# Patient Record
Sex: Male | Born: 1960 | Race: White | Hispanic: No | Marital: Married | Smoking: Never smoker
Health system: Southern US, Community
[De-identification: ages and names within clinical notes are randomized; demographics above are authoritative.]

## PROBLEM LIST (undated history)

## (undated) DIAGNOSIS — F32A Depression, unspecified: Secondary | ICD-10-CM

## (undated) DIAGNOSIS — E7800 Pure hypercholesterolemia, unspecified: Secondary | ICD-10-CM

## (undated) HISTORY — PX: WISDOM TOOTH EXTRACTION: SHX21

---

## 2001-10-25 ENCOUNTER — Ambulatory Visit (HOSPITAL_COMMUNITY): Admission: RE | Admit: 2001-10-25 | Discharge: 2001-10-25 | Payer: Self-pay | Admitting: Gastroenterology

## 2011-01-14 ENCOUNTER — Ambulatory Visit (INDEPENDENT_AMBULATORY_CARE_PROVIDER_SITE_OTHER): Payer: BC Managed Care – PPO

## 2011-01-14 DIAGNOSIS — J111 Influenza due to unidentified influenza virus with other respiratory manifestations: Secondary | ICD-10-CM

## 2013-11-10 ENCOUNTER — Other Ambulatory Visit (HOSPITAL_COMMUNITY): Payer: Self-pay | Admitting: Internal Medicine

## 2013-11-10 ENCOUNTER — Ambulatory Visit (HOSPITAL_COMMUNITY)
Admission: RE | Admit: 2013-11-10 | Discharge: 2013-11-10 | Disposition: A | Discharge status: Home / Self Care | Payer: BC Managed Care – PPO | Source: Ambulatory Visit | Attending: Vascular Surgery | Admitting: Vascular Surgery

## 2013-11-10 DIAGNOSIS — I739 Peripheral vascular disease, unspecified: Secondary | ICD-10-CM | POA: Diagnosis present

## 2015-05-28 DIAGNOSIS — D123 Benign neoplasm of transverse colon: Secondary | ICD-10-CM | POA: Diagnosis not present

## 2015-05-28 DIAGNOSIS — K635 Polyp of colon: Secondary | ICD-10-CM | POA: Diagnosis not present

## 2015-05-28 DIAGNOSIS — D122 Benign neoplasm of ascending colon: Secondary | ICD-10-CM | POA: Diagnosis not present

## 2015-05-28 DIAGNOSIS — Z1211 Encounter for screening for malignant neoplasm of colon: Secondary | ICD-10-CM | POA: Diagnosis not present

## 2015-11-19 DIAGNOSIS — R8299 Other abnormal findings in urine: Secondary | ICD-10-CM | POA: Diagnosis not present

## 2015-11-19 DIAGNOSIS — Z125 Encounter for screening for malignant neoplasm of prostate: Secondary | ICD-10-CM | POA: Diagnosis not present

## 2015-11-19 DIAGNOSIS — Z0000 Encounter for general adult medical examination without abnormal findings: Secondary | ICD-10-CM | POA: Diagnosis not present

## 2015-11-26 DIAGNOSIS — D126 Benign neoplasm of colon, unspecified: Secondary | ICD-10-CM | POA: Diagnosis not present

## 2015-11-26 DIAGNOSIS — R3129 Other microscopic hematuria: Secondary | ICD-10-CM | POA: Diagnosis not present

## 2015-11-26 DIAGNOSIS — L918 Other hypertrophic disorders of the skin: Secondary | ICD-10-CM | POA: Diagnosis not present

## 2015-11-26 DIAGNOSIS — Z1389 Encounter for screening for other disorder: Secondary | ICD-10-CM | POA: Diagnosis not present

## 2015-11-26 DIAGNOSIS — E784 Other hyperlipidemia: Secondary | ICD-10-CM | POA: Diagnosis not present

## 2015-11-26 DIAGNOSIS — R7309 Other abnormal glucose: Secondary | ICD-10-CM | POA: Diagnosis not present

## 2015-11-26 DIAGNOSIS — Z0000 Encounter for general adult medical examination without abnormal findings: Secondary | ICD-10-CM | POA: Diagnosis not present

## 2015-11-26 DIAGNOSIS — Z23 Encounter for immunization: Secondary | ICD-10-CM | POA: Diagnosis not present

## 2016-11-23 DIAGNOSIS — R7309 Other abnormal glucose: Secondary | ICD-10-CM | POA: Diagnosis not present

## 2016-11-23 DIAGNOSIS — Z0000 Encounter for general adult medical examination without abnormal findings: Secondary | ICD-10-CM | POA: Diagnosis not present

## 2016-11-23 DIAGNOSIS — Z125 Encounter for screening for malignant neoplasm of prostate: Secondary | ICD-10-CM | POA: Diagnosis not present

## 2016-11-30 DIAGNOSIS — R7309 Other abnormal glucose: Secondary | ICD-10-CM | POA: Diagnosis not present

## 2016-11-30 DIAGNOSIS — E663 Overweight: Secondary | ICD-10-CM | POA: Diagnosis not present

## 2016-11-30 DIAGNOSIS — D126 Benign neoplasm of colon, unspecified: Secondary | ICD-10-CM | POA: Diagnosis not present

## 2016-11-30 DIAGNOSIS — Z23 Encounter for immunization: Secondary | ICD-10-CM | POA: Diagnosis not present

## 2016-11-30 DIAGNOSIS — Z1389 Encounter for screening for other disorder: Secondary | ICD-10-CM | POA: Diagnosis not present

## 2016-11-30 DIAGNOSIS — Z0000 Encounter for general adult medical examination without abnormal findings: Secondary | ICD-10-CM | POA: Diagnosis not present

## 2016-11-30 DIAGNOSIS — E7800 Pure hypercholesterolemia, unspecified: Secondary | ICD-10-CM | POA: Diagnosis not present

## 2017-04-06 DIAGNOSIS — T700XXA Otitic barotrauma, initial encounter: Secondary | ICD-10-CM | POA: Diagnosis not present

## 2017-06-23 DIAGNOSIS — H1013 Acute atopic conjunctivitis, bilateral: Secondary | ICD-10-CM | POA: Diagnosis not present

## 2017-11-29 DIAGNOSIS — R82998 Other abnormal findings in urine: Secondary | ICD-10-CM | POA: Diagnosis not present

## 2017-11-29 DIAGNOSIS — Z125 Encounter for screening for malignant neoplasm of prostate: Secondary | ICD-10-CM | POA: Diagnosis not present

## 2017-11-29 DIAGNOSIS — Z0000 Encounter for general adult medical examination without abnormal findings: Secondary | ICD-10-CM | POA: Diagnosis not present

## 2017-11-29 DIAGNOSIS — R7309 Other abnormal glucose: Secondary | ICD-10-CM | POA: Diagnosis not present

## 2017-12-06 DIAGNOSIS — R7309 Other abnormal glucose: Secondary | ICD-10-CM | POA: Diagnosis not present

## 2017-12-06 DIAGNOSIS — Z23 Encounter for immunization: Secondary | ICD-10-CM | POA: Diagnosis not present

## 2017-12-06 DIAGNOSIS — E663 Overweight: Secondary | ICD-10-CM | POA: Diagnosis not present

## 2017-12-06 DIAGNOSIS — D126 Benign neoplasm of colon, unspecified: Secondary | ICD-10-CM | POA: Diagnosis not present

## 2017-12-06 DIAGNOSIS — Z1331 Encounter for screening for depression: Secondary | ICD-10-CM | POA: Diagnosis not present

## 2017-12-06 DIAGNOSIS — Z125 Encounter for screening for malignant neoplasm of prostate: Secondary | ICD-10-CM | POA: Diagnosis not present

## 2017-12-06 DIAGNOSIS — E7800 Pure hypercholesterolemia, unspecified: Secondary | ICD-10-CM | POA: Diagnosis not present

## 2017-12-06 DIAGNOSIS — Z0000 Encounter for general adult medical examination without abnormal findings: Secondary | ICD-10-CM | POA: Diagnosis not present

## 2017-12-15 DIAGNOSIS — Z1212 Encounter for screening for malignant neoplasm of rectum: Secondary | ICD-10-CM | POA: Diagnosis not present

## 2018-11-27 DIAGNOSIS — Z20828 Contact with and (suspected) exposure to other viral communicable diseases: Secondary | ICD-10-CM | POA: Diagnosis not present

## 2018-12-07 DIAGNOSIS — Z0000 Encounter for general adult medical examination without abnormal findings: Secondary | ICD-10-CM | POA: Diagnosis not present

## 2018-12-07 DIAGNOSIS — E7800 Pure hypercholesterolemia, unspecified: Secondary | ICD-10-CM | POA: Diagnosis not present

## 2018-12-07 DIAGNOSIS — Z125 Encounter for screening for malignant neoplasm of prostate: Secondary | ICD-10-CM | POA: Diagnosis not present

## 2018-12-07 DIAGNOSIS — R739 Hyperglycemia, unspecified: Secondary | ICD-10-CM | POA: Diagnosis not present

## 2018-12-09 DIAGNOSIS — E7800 Pure hypercholesterolemia, unspecified: Secondary | ICD-10-CM | POA: Diagnosis not present

## 2018-12-09 DIAGNOSIS — R82998 Other abnormal findings in urine: Secondary | ICD-10-CM | POA: Diagnosis not present

## 2018-12-12 DIAGNOSIS — D126 Benign neoplasm of colon, unspecified: Secondary | ICD-10-CM | POA: Diagnosis not present

## 2018-12-12 DIAGNOSIS — Z1331 Encounter for screening for depression: Secondary | ICD-10-CM | POA: Diagnosis not present

## 2018-12-12 DIAGNOSIS — F419 Anxiety disorder, unspecified: Secondary | ICD-10-CM | POA: Diagnosis not present

## 2018-12-12 DIAGNOSIS — E7800 Pure hypercholesterolemia, unspecified: Secondary | ICD-10-CM | POA: Diagnosis not present

## 2018-12-12 DIAGNOSIS — Z0000 Encounter for general adult medical examination without abnormal findings: Secondary | ICD-10-CM | POA: Diagnosis not present

## 2018-12-12 DIAGNOSIS — R739 Hyperglycemia, unspecified: Secondary | ICD-10-CM | POA: Diagnosis not present

## 2018-12-12 DIAGNOSIS — Z1212 Encounter for screening for malignant neoplasm of rectum: Secondary | ICD-10-CM | POA: Diagnosis not present

## 2019-08-16 DIAGNOSIS — Z20822 Contact with and (suspected) exposure to covid-19: Secondary | ICD-10-CM | POA: Diagnosis not present

## 2019-08-16 DIAGNOSIS — Z03818 Encounter for observation for suspected exposure to other biological agents ruled out: Secondary | ICD-10-CM | POA: Diagnosis not present

## 2019-11-02 DIAGNOSIS — M170 Bilateral primary osteoarthritis of knee: Secondary | ICD-10-CM | POA: Diagnosis not present

## 2019-12-05 DIAGNOSIS — E7800 Pure hypercholesterolemia, unspecified: Secondary | ICD-10-CM | POA: Diagnosis not present

## 2019-12-05 DIAGNOSIS — Z125 Encounter for screening for malignant neoplasm of prostate: Secondary | ICD-10-CM | POA: Diagnosis not present

## 2019-12-05 DIAGNOSIS — R739 Hyperglycemia, unspecified: Secondary | ICD-10-CM | POA: Diagnosis not present

## 2019-12-05 DIAGNOSIS — Z0000 Encounter for general adult medical examination without abnormal findings: Secondary | ICD-10-CM | POA: Diagnosis not present

## 2019-12-11 DIAGNOSIS — R82998 Other abnormal findings in urine: Secondary | ICD-10-CM | POA: Diagnosis not present

## 2019-12-11 DIAGNOSIS — E7800 Pure hypercholesterolemia, unspecified: Secondary | ICD-10-CM | POA: Diagnosis not present

## 2019-12-14 DIAGNOSIS — Z0000 Encounter for general adult medical examination without abnormal findings: Secondary | ICD-10-CM | POA: Diagnosis not present

## 2019-12-14 DIAGNOSIS — R739 Hyperglycemia, unspecified: Secondary | ICD-10-CM | POA: Diagnosis not present

## 2020-01-10 DIAGNOSIS — Z1212 Encounter for screening for malignant neoplasm of rectum: Secondary | ICD-10-CM | POA: Diagnosis not present

## 2020-02-10 DIAGNOSIS — Z1152 Encounter for screening for COVID-19: Secondary | ICD-10-CM | POA: Diagnosis not present

## 2020-02-14 DIAGNOSIS — Z20822 Contact with and (suspected) exposure to covid-19: Secondary | ICD-10-CM | POA: Diagnosis not present

## 2020-02-26 DIAGNOSIS — Z20822 Contact with and (suspected) exposure to covid-19: Secondary | ICD-10-CM | POA: Diagnosis not present

## 2020-02-26 DIAGNOSIS — Z03818 Encounter for observation for suspected exposure to other biological agents ruled out: Secondary | ICD-10-CM | POA: Diagnosis not present

## 2020-05-06 DIAGNOSIS — M5442 Lumbago with sciatica, left side: Secondary | ICD-10-CM | POA: Diagnosis not present

## 2020-05-06 DIAGNOSIS — G8929 Other chronic pain: Secondary | ICD-10-CM | POA: Diagnosis not present

## 2020-05-06 DIAGNOSIS — M5441 Lumbago with sciatica, right side: Secondary | ICD-10-CM | POA: Diagnosis not present

## 2020-05-29 DIAGNOSIS — M545 Low back pain, unspecified: Secondary | ICD-10-CM | POA: Diagnosis not present

## 2020-05-29 DIAGNOSIS — M5442 Lumbago with sciatica, left side: Secondary | ICD-10-CM | POA: Diagnosis not present

## 2020-07-08 DIAGNOSIS — M5442 Lumbago with sciatica, left side: Secondary | ICD-10-CM | POA: Diagnosis not present

## 2020-07-08 DIAGNOSIS — M48061 Spinal stenosis, lumbar region without neurogenic claudication: Secondary | ICD-10-CM | POA: Diagnosis not present

## 2020-07-08 DIAGNOSIS — M4316 Spondylolisthesis, lumbar region: Secondary | ICD-10-CM | POA: Diagnosis not present

## 2020-07-08 DIAGNOSIS — M47816 Spondylosis without myelopathy or radiculopathy, lumbar region: Secondary | ICD-10-CM | POA: Diagnosis not present

## 2020-12-11 DIAGNOSIS — R972 Elevated prostate specific antigen [PSA]: Secondary | ICD-10-CM | POA: Diagnosis not present

## 2020-12-11 DIAGNOSIS — R739 Hyperglycemia, unspecified: Secondary | ICD-10-CM | POA: Diagnosis not present

## 2020-12-11 DIAGNOSIS — E7800 Pure hypercholesterolemia, unspecified: Secondary | ICD-10-CM | POA: Diagnosis not present

## 2020-12-18 DIAGNOSIS — Z1212 Encounter for screening for malignant neoplasm of rectum: Secondary | ICD-10-CM | POA: Diagnosis not present

## 2020-12-18 DIAGNOSIS — Z1339 Encounter for screening examination for other mental health and behavioral disorders: Secondary | ICD-10-CM | POA: Diagnosis not present

## 2020-12-18 DIAGNOSIS — Z0000 Encounter for general adult medical examination without abnormal findings: Secondary | ICD-10-CM | POA: Diagnosis not present

## 2020-12-18 DIAGNOSIS — R82998 Other abnormal findings in urine: Secondary | ICD-10-CM | POA: Diagnosis not present

## 2020-12-18 DIAGNOSIS — E7800 Pure hypercholesterolemia, unspecified: Secondary | ICD-10-CM | POA: Diagnosis not present

## 2020-12-18 DIAGNOSIS — R739 Hyperglycemia, unspecified: Secondary | ICD-10-CM | POA: Diagnosis not present

## 2020-12-18 DIAGNOSIS — Z1331 Encounter for screening for depression: Secondary | ICD-10-CM | POA: Diagnosis not present

## 2021-03-26 DIAGNOSIS — B360 Pityriasis versicolor: Secondary | ICD-10-CM | POA: Diagnosis not present

## 2021-03-26 DIAGNOSIS — L304 Erythema intertrigo: Secondary | ICD-10-CM | POA: Diagnosis not present

## 2021-03-26 DIAGNOSIS — L918 Other hypertrophic disorders of the skin: Secondary | ICD-10-CM | POA: Diagnosis not present

## 2021-05-13 DIAGNOSIS — Z23 Encounter for immunization: Secondary | ICD-10-CM | POA: Diagnosis not present

## 2021-05-20 DIAGNOSIS — Z6828 Body mass index (BMI) 28.0-28.9, adult: Secondary | ICD-10-CM | POA: Diagnosis not present

## 2021-05-20 DIAGNOSIS — M48062 Spinal stenosis, lumbar region with neurogenic claudication: Secondary | ICD-10-CM | POA: Diagnosis not present

## 2021-05-20 DIAGNOSIS — M47816 Spondylosis without myelopathy or radiculopathy, lumbar region: Secondary | ICD-10-CM | POA: Diagnosis not present

## 2021-05-29 ENCOUNTER — Other Ambulatory Visit: Payer: Self-pay | Admitting: Neurosurgery

## 2021-05-29 DIAGNOSIS — M48062 Spinal stenosis, lumbar region with neurogenic claudication: Secondary | ICD-10-CM

## 2021-06-02 DIAGNOSIS — M48062 Spinal stenosis, lumbar region with neurogenic claudication: Secondary | ICD-10-CM | POA: Diagnosis not present

## 2021-06-05 ENCOUNTER — Ambulatory Visit
Admission: RE | Admit: 2021-06-05 | Discharge: 2021-06-05 | Disposition: A | Discharge status: Home / Self Care | Payer: BC Managed Care – PPO | Source: Ambulatory Visit | Attending: Neurosurgery | Admitting: Neurosurgery

## 2021-06-05 DIAGNOSIS — M4316 Spondylolisthesis, lumbar region: Secondary | ICD-10-CM | POA: Diagnosis not present

## 2021-06-05 DIAGNOSIS — M48062 Spinal stenosis, lumbar region with neurogenic claudication: Secondary | ICD-10-CM

## 2021-06-05 DIAGNOSIS — M48061 Spinal stenosis, lumbar region without neurogenic claudication: Secondary | ICD-10-CM | POA: Diagnosis not present

## 2021-06-05 MED ORDER — DIAZEPAM 5 MG PO TABS
10.0000 mg | ORAL_TABLET | Freq: Once | ORAL | Status: AC
  Administered 2021-06-05: 5 mg via ORAL

## 2021-06-05 MED ORDER — ONDANSETRON HCL 4 MG/2ML IJ SOLN: 4.0000 mg | Freq: Once | INTRAMUSCULAR | Status: DC | PRN

## 2021-06-05 MED ORDER — IOPAMIDOL (ISOVUE-M 200) INJECTION 41%
15.0000 mL | Freq: Once | INTRAMUSCULAR | Status: AC
  Administered 2021-06-05: 15 mL via INTRATHECAL

## 2021-06-05 MED ORDER — MEPERIDINE HCL 50 MG/ML IJ SOLN: 50.0000 mg | Freq: Once | INTRAMUSCULAR | Status: DC | PRN

## 2021-06-05 NOTE — Discharge Instructions (Signed)

## 2021-06-18 DIAGNOSIS — Z125 Encounter for screening for malignant neoplasm of prostate: Secondary | ICD-10-CM | POA: Diagnosis not present

## 2021-06-27 DIAGNOSIS — M1712 Unilateral primary osteoarthritis, left knee: Secondary | ICD-10-CM | POA: Diagnosis not present

## 2021-07-01 DIAGNOSIS — M48062 Spinal stenosis, lumbar region with neurogenic claudication: Secondary | ICD-10-CM | POA: Diagnosis not present

## 2021-07-01 DIAGNOSIS — Z6828 Body mass index (BMI) 28.0-28.9, adult: Secondary | ICD-10-CM | POA: Diagnosis not present

## 2021-07-03 DIAGNOSIS — M25561 Pain in right knee: Secondary | ICD-10-CM | POA: Diagnosis not present

## 2021-07-16 DIAGNOSIS — M25661 Stiffness of right knee, not elsewhere classified: Secondary | ICD-10-CM | POA: Diagnosis not present

## 2021-07-16 DIAGNOSIS — M25662 Stiffness of left knee, not elsewhere classified: Secondary | ICD-10-CM | POA: Diagnosis not present

## 2021-07-22 DIAGNOSIS — M25662 Stiffness of left knee, not elsewhere classified: Secondary | ICD-10-CM | POA: Diagnosis not present

## 2021-07-22 DIAGNOSIS — M25661 Stiffness of right knee, not elsewhere classified: Secondary | ICD-10-CM | POA: Diagnosis not present

## 2021-07-24 DIAGNOSIS — M1712 Unilateral primary osteoarthritis, left knee: Secondary | ICD-10-CM | POA: Diagnosis not present

## 2021-07-24 DIAGNOSIS — M25561 Pain in right knee: Secondary | ICD-10-CM | POA: Diagnosis not present

## 2021-07-29 DIAGNOSIS — M25661 Stiffness of right knee, not elsewhere classified: Secondary | ICD-10-CM | POA: Diagnosis not present

## 2021-07-29 DIAGNOSIS — M25662 Stiffness of left knee, not elsewhere classified: Secondary | ICD-10-CM | POA: Diagnosis not present

## 2021-08-01 DIAGNOSIS — M25661 Stiffness of right knee, not elsewhere classified: Secondary | ICD-10-CM | POA: Diagnosis not present

## 2021-08-01 DIAGNOSIS — M25662 Stiffness of left knee, not elsewhere classified: Secondary | ICD-10-CM | POA: Diagnosis not present

## 2021-08-04 DIAGNOSIS — M25662 Stiffness of left knee, not elsewhere classified: Secondary | ICD-10-CM | POA: Diagnosis not present

## 2021-08-04 DIAGNOSIS — M25661 Stiffness of right knee, not elsewhere classified: Secondary | ICD-10-CM | POA: Diagnosis not present

## 2021-08-08 DIAGNOSIS — M25661 Stiffness of right knee, not elsewhere classified: Secondary | ICD-10-CM | POA: Diagnosis not present

## 2021-08-08 DIAGNOSIS — M25662 Stiffness of left knee, not elsewhere classified: Secondary | ICD-10-CM | POA: Diagnosis not present

## 2021-08-13 DIAGNOSIS — M25662 Stiffness of left knee, not elsewhere classified: Secondary | ICD-10-CM | POA: Diagnosis not present

## 2021-08-13 DIAGNOSIS — M25661 Stiffness of right knee, not elsewhere classified: Secondary | ICD-10-CM | POA: Diagnosis not present

## 2021-08-26 DIAGNOSIS — M25661 Stiffness of right knee, not elsewhere classified: Secondary | ICD-10-CM | POA: Diagnosis not present

## 2021-08-26 DIAGNOSIS — M25662 Stiffness of left knee, not elsewhere classified: Secondary | ICD-10-CM | POA: Diagnosis not present

## 2021-08-29 DIAGNOSIS — M25661 Stiffness of right knee, not elsewhere classified: Secondary | ICD-10-CM | POA: Diagnosis not present

## 2021-08-29 DIAGNOSIS — M25662 Stiffness of left knee, not elsewhere classified: Secondary | ICD-10-CM | POA: Diagnosis not present

## 2021-09-11 ENCOUNTER — Other Ambulatory Visit: Payer: Self-pay | Admitting: Neurosurgery

## 2021-09-17 NOTE — Progress Notes (Signed)
Surgical Instructions    Your procedure is scheduled on 09/22/21.  Report to [facility] Main Entrance "A" at 1:20 P.M., then check in with the Admitting office.  Call this number if you have problems the morning of surgery:  [phone]   If you have any questions prior to your surgery date call [phone]: Open Monday-Friday 8am-4pm    Remember:  Do not eat or drink after midnight the night before your surgery      Take these medicines the morning of surgery with A SIP OF WATER:  desvenlafaxine (PRISTIQ) rosuvastatin (CRESTOR)  As of today, STOP taking any Aspirin (unless otherwise instructed by your surgeon) Aleve, Naproxen, Ibuprofen, Motrin, Advil, Goody's, BC's, all herbal medications, fish oil, and all vitamins.           Do not wear jewelry or makeup. Do not wear lotions, powders, cologne or deodorant. Men may shave face and neck. Do not bring valuables to the hospital. Do not wear nail polish, gel polish, artificial nails, or any other type of covering on natural nails (fingers and toes) If you have artificial nails or gel coating that need to be removed by a nail salon, please have this removed prior to surgery. Artificial nails or gel coating may interfere with anesthesia's ability to adequately monitor your vital signs.  [facility] is not responsible for any belongings or valuables.    Do NOT Smoke (Tobacco/Vaping)  24 hours prior to your procedure  If you use a CPAP at night, you may bring your mask for your overnight stay.   Contacts, glasses, hearing aids, dentures or partials may not be worn into surgery, please bring cases for these belongings   For patients admitted to the hospital, discharge time will be determined by your treatment team.   Patients discharged the day of surgery will not be allowed to drive home, and someone needs to stay with them for 24 hours.   SURGICAL WAITING ROOM VISITATION Patients having surgery or a procedure may have no more  than 2 support people in the waiting area - these visitors may rotate.   Children under the age of 24 must have an adult with them who is not the patient. If the patient needs to stay at the hospital during part of their recovery, the visitor guidelines for inpatient rooms apply. Pre-op nurse will coordinate an appropriate time for 1 support person to accompany patient in pre-op.  This support person may not rotate.   Please refer to the [facility] website for the visitor guidelines for Inpatients (after your surgery is over and you are in a regular room).    Special instructions:    Oral Hygiene is also important to reduce your risk of infection.  Remember - BRUSH YOUR TEETH THE MORNING OF SURGERY WITH YOUR REGULAR TOOTHPASTE   [place]- Preparing For Surgery  Before surgery, you can play an important role. Because skin is not sterile, your skin needs to be as free of germs as possible. You can reduce the number of germs on your skin by washing with CHG (chlorahexidine gluconate) Soap before surgery.  CHG is an antiseptic cleaner which kills germs and bonds with the skin to continue killing germs even after washing.     Please do not use if you have an allergy to CHG or antibacterial soaps. If your skin becomes reddened/irritated stop using the CHG.  Do not shave (including legs and underarms) for at least 48 hours prior to first CHG shower.  It is OK to shave your face.  Please follow these instructions carefully.     Shower the NIGHT BEFORE SURGERY and the MORNING OF SURGERY with CHG Soap.   If you chose to wash your hair, wash your hair first as usual with your normal shampoo. After you shampoo, rinse your hair and body thoroughly to remove the shampoo.  Then [employer] and genitals (private parts) with your normal soap and rinse thoroughly to remove soap.  After that Use CHG Soap as you would any other liquid soap. You can apply CHG directly to the skin and wash gently with a  scrungie or a clean washcloth.   Apply the CHG Soap to your body ONLY FROM THE NECK DOWN.  Do not use on open wounds or open sores. Avoid contact with your eyes, ears, mouth and genitals (private parts). Wash Face and genitals (private parts)  with your normal soap.   Wash thoroughly, paying special attention to the area where your surgery will be performed.  Thoroughly rinse your body with warm water from the neck down.  DO NOT shower/wash with your normal soap after using and rinsing off the CHG Soap.  Pat yourself dry with a CLEAN TOWEL.  Wear CLEAN PAJAMAS to bed the night before surgery  Place CLEAN SHEETS on your bed the night before your surgery  DO NOT SLEEP WITH PETS.   Day of Surgery: Take a shower with CHG soap. Wear Clean/Comfortable clothing the morning of surgery Do not apply any deodorants/lotions.   Remember to brush your teeth WITH YOUR REGULAR TOOTHPASTE.    If you received a COVID test during your pre-op visit, it is requested that you wear a mask when out in public, stay away from anyone that may not be feeling well, and notify your surgeon if you develop symptoms. If you have been in contact with anyone that has tested positive in the last 10 days, please notify your surgeon.    Please read over the following fact sheets that you were given.

## 2021-09-18 ENCOUNTER — Other Ambulatory Visit: Payer: Self-pay

## 2021-09-18 ENCOUNTER — Encounter (HOSPITAL_COMMUNITY): Payer: Self-pay

## 2021-09-18 ENCOUNTER — Encounter (HOSPITAL_COMMUNITY)
Admission: RE | Admit: 2021-09-18 | Discharge: 2021-09-18 | Disposition: A | Discharge status: Home / Self Care | Payer: BC Managed Care – PPO | Source: Ambulatory Visit | Attending: Neurosurgery | Admitting: Neurosurgery

## 2021-09-18 ENCOUNTER — Other Ambulatory Visit: Payer: Self-pay | Admitting: Neurosurgery

## 2021-09-18 VITALS — BP 145/102 | HR 84 | Temp 97.6°F | Resp 17 | Ht 63.0 in | Wt 158.9 lb

## 2021-09-18 DIAGNOSIS — R030 Elevated blood-pressure reading, without diagnosis of hypertension: Secondary | ICD-10-CM | POA: Diagnosis not present

## 2021-09-18 DIAGNOSIS — F418 Other specified anxiety disorders: Secondary | ICD-10-CM | POA: Insufficient documentation

## 2021-09-18 DIAGNOSIS — Z01818 Encounter for other preprocedural examination: Secondary | ICD-10-CM

## 2021-09-18 LAB — CBC
HCT: 46.5 % (ref 39.0–52.0)
Hemoglobin: 15.1 g/dL (ref 13.0–17.0)
MCH: 28.5 pg (ref 26.0–34.0)
MCHC: 32.5 g/dL (ref 30.0–36.0)
MCV: 87.9 fL (ref 80.0–100.0)
Platelets: 247 10*3/uL (ref 150–400)
RBC: 5.29 MIL/uL (ref 4.22–5.81)
RDW: 13.3 % (ref 11.5–15.5)
WBC: 10.7 10*3/uL — ABNORMAL HIGH (ref 4.0–10.5)
nRBC: 0 % (ref 0.0–0.2)

## 2021-09-18 LAB — SURGICAL PCR SCREEN
MRSA, PCR: NEGATIVE
Staphylococcus aureus: NEGATIVE

## 2021-09-18 NOTE — Progress Notes (Signed)
Anesthesia Chart Review:  Elevated BP noted at PAT visit, 145/102 with similar on recheck.  EKG shows NSR at rate 72, minimal voltage criteria for LVH.  Pt denies hx of HTN, denies every being on any antiHTN meds. Says BP has been normal at PCP. Admits to significant anxiety regarding having surgery. Also reports using more ibuprofen recently to help with LBP. Denies any symptoms of CP, HA, palpitations. He does not regularly check BP at home but he says his wife has a monitor. He was instructed to check several times per day leading up to surgery. If diastolic remains elevated ~100 he is to reach out to his PCP Dr. Wylene Simmer for input. If BP normal at home, likely nothing further needed. He was also advised to discontinue NSAIDs.  He understands that elevated blood pressure could be cause for cancellation on day of surgery.  I called the patient on 09/19/2021 to follow-up on blood pressure.  He states he used his wife's blood pressure cuff at home and it was still a bit elevated, so he went to see his PCP this morning for evaluation.  He said blood pressure readings in the office were better with diastolic around 90 and systolic within normal range.  He states his PCP did not have concern about him proceeding with surgery, and felt that some of this was likely situational.  Patient has anxiety about upcoming surgery.  Previous blood pressure readings in Dr. Deneen Harts office have been normotensive.  Anticipate can proceed as planned barring acute status change.  Preop CBC reviewed, unremarkable.   Zannie Cove [facility] Short Stay Center/Anesthesiology Phone [phone] 09/19/2021 1:15 PM

## 2021-09-18 NOTE — Progress Notes (Signed)
PCP - Dr. Guerry Bruin Cardiologist - denies  PPM/ICD - denies   Chest x-ray - denies EKG - 09/18/21 Stress Test - denies ECHO - denies Cardiac Cath - denies  Sleep Study - denies  DM- denies  ASA/Blood Thinner Instructions: n/a   ERAS Protcol - no, NPO   COVID TEST- n/a   Anesthesia review: yes. Pt hypertensive at appt. He complains of a slight headache. No other sx. No hx of HTN. Pt advised, per Fayrene Fearing, to check BP at home and if it is still running as high to contact his PCP. Records requested from Dr. Wylene Simmer  Patient denies shortness of breath, fever, cough and chest pain at PAT appointment   All instructions explained to the patient, with a verbal understanding of the material. Patient agrees to go over the instructions while at home for a better understanding. The opportunity to ask questions was provided.

## 2021-09-19 DIAGNOSIS — R030 Elevated blood-pressure reading, without diagnosis of hypertension: Secondary | ICD-10-CM | POA: Diagnosis not present

## 2021-09-19 NOTE — Anesthesia Preprocedure Evaluation (Addendum)
Anesthesia Evaluation  Patient identified by MRN, date of birth, ID band Patient awake    Reviewed: NPO status , Patient's Chart, lab work & pertinent test results  Airway Mallampati: II  TM Distance: >3 FB     Dental   Pulmonary neg pulmonary ROS,    breath sounds clear to auscultation       Cardiovascular hypertension (no meds),  Rhythm:Regular Rate:Normal     Neuro/Psych negative neurological ROS     GI/Hepatic negative GI ROS, Neg liver ROS,   Endo/Other    Renal/GU negative Renal ROS     Musculoskeletal   Abdominal   Peds  Hematology   Anesthesia Other Findings   Reproductive/Obstetrics                            Anesthesia Physical Anesthesia Plan  ASA: 2  Anesthesia Plan: General   Post-op Pain Management: Tylenol PO (pre-op)*   Induction: Intravenous  PONV Risk Score and Plan: 2 and Ondansetron and Dexamethasone  Airway Management Planned: Oral ETT  Additional Equipment: None  Intra-op Plan:   Post-operative Plan: Extubation in OR  Informed Consent: I have reviewed the patients History and Physical, chart, labs and discussed the procedure including the risks, benefits and alternatives for the proposed anesthesia with the patient or authorized representative who has indicated his/her understanding and acceptance.     Dental advisory given  Plan Discussed with: CRNA and Anesthesiologist  Anesthesia Plan Comments: (PAT note by Antionette Poles, PA-C:  Elevated BP noted at PAT visit, 145/102 with similar on recheck.  EKG shows NSR at rate 72, minimal voltage criteria for LVH.  Pt denies hx of HTN, denies every being on any antiHTN meds. Says BP has been normal at PCP. Admits to significant anxiety regarding having surgery. Also reports using more ibuprofen recently to help with LBP. Denies any symptoms of CP, HA, palpitations. He does not regularly check BP at home but he says  his wife has a monitor. He was instructed to check several times per day leading up to surgery. If diastolic remains elevated ~100 he is to reach out to his PCP Dr. Wylene Simmer for input. If BP normal at home, likely nothing further needed. He was also advised to discontinue NSAIDs.  He understands that elevated blood pressure could be cause for cancellation on day of surgery.  I called the patient on 09/19/2021 to follow-up on blood pressure.  He states he used his wife's blood pressure cuff at home and it was still a bit elevated, so he went to see his PCP this morning for evaluation.  He said blood pressure readings in the office were better with diastolic around 90 and systolic within normal range.  He states his PCP did not have concern about him proceeding with surgery, and felt that some of this was likely situational.  Patient has anxiety about upcoming surgery.  Previous blood pressure readings in Dr. Deneen Harts office have been normotensive.  Anticipate can proceed as planned barring acute status change.  Preop CBC reviewed, unremarkable. )      Anesthesia Quick Evaluation

## 2021-09-22 ENCOUNTER — Other Ambulatory Visit: Payer: Self-pay

## 2021-09-22 ENCOUNTER — Ambulatory Visit (HOSPITAL_COMMUNITY): Payer: BC Managed Care – PPO

## 2021-09-22 ENCOUNTER — Encounter (HOSPITAL_COMMUNITY)
Admission: RE | Disposition: A | Discharge status: Home / Self Care | Payer: Self-pay | Source: Home / Self Care | Attending: Neurosurgery

## 2021-09-22 ENCOUNTER — Ambulatory Visit (HOSPITAL_COMMUNITY): Payer: BC Managed Care – PPO | Admitting: Physician Assistant

## 2021-09-22 ENCOUNTER — Encounter (HOSPITAL_COMMUNITY): Payer: Self-pay | Admitting: Neurosurgery

## 2021-09-22 ENCOUNTER — Ambulatory Visit (HOSPITAL_COMMUNITY): Payer: BC Managed Care – PPO | Admitting: Vascular Surgery

## 2021-09-22 ENCOUNTER — Ambulatory Visit (HOSPITAL_COMMUNITY)
Admission: RE | Admit: 2021-09-22 | Discharge: 2021-09-23 | Disposition: A | Discharge status: Home / Self Care | Payer: BC Managed Care – PPO | Attending: Neurosurgery | Admitting: Neurosurgery

## 2021-09-22 DIAGNOSIS — M48062 Spinal stenosis, lumbar region with neurogenic claudication: Secondary | ICD-10-CM | POA: Diagnosis not present

## 2021-09-22 DIAGNOSIS — M5416 Radiculopathy, lumbar region: Secondary | ICD-10-CM | POA: Diagnosis not present

## 2021-09-22 DIAGNOSIS — M5136 Other intervertebral disc degeneration, lumbar region: Secondary | ICD-10-CM | POA: Diagnosis not present

## 2021-09-22 HISTORY — DX: Pure hypercholesterolemia, unspecified: E78.00

## 2021-09-22 HISTORY — DX: Depression, unspecified: F32.A

## 2021-09-22 HISTORY — PX: LUMBAR LAMINECTOMY/DECOMPRESSION MICRODISCECTOMY: SHX5026

## 2021-09-22 SURGERY — LUMBAR LAMINECTOMY/DECOMPRESSION MICRODISCECTOMY 3 LEVELS
Anesthesia: General | Laterality: Bilateral

## 2021-09-22 MED ORDER — PHENYLEPHRINE 80 MCG/ML (10ML) SYRINGE FOR IV PUSH (FOR BLOOD PRESSURE SUPPORT)
PREFILLED_SYRINGE | INTRAVENOUS | Status: AC
  Filled 2021-09-22: qty 20

## 2021-09-22 MED ORDER — THROMBIN 5000 UNITS EX SOLR
CUTANEOUS | Status: DC | PRN
  Administered 2021-09-22 (×2): 5000 [IU] via TOPICAL

## 2021-09-22 MED ORDER — LIDOCAINE 2% (20 MG/ML) 5 ML SYRINGE
INTRAMUSCULAR | Status: DC | PRN
  Administered 2021-09-22: 100 mg via INTRAVENOUS

## 2021-09-22 MED ORDER — PROPOFOL 10 MG/ML IV BOLUS
INTRAVENOUS | Status: AC
  Filled 2021-09-22: qty 20

## 2021-09-22 MED ORDER — SODIUM CHLORIDE 0.9% FLUSH: 3.0000 mL | Freq: Two times a day (BID) | INTRAVENOUS | Status: DC

## 2021-09-22 MED ORDER — HYDROMORPHONE HCL 1 MG/ML IJ SOLN
INTRAMUSCULAR | Status: DC | PRN
  Administered 2021-09-22: .5 mg via INTRAVENOUS

## 2021-09-22 MED ORDER — SUCCINYLCHOLINE CHLORIDE 200 MG/10ML IV SOSY
PREFILLED_SYRINGE | INTRAVENOUS | Status: AC
  Filled 2021-09-22: qty 10

## 2021-09-22 MED ORDER — ACETAMINOPHEN 325 MG PO TABS: 650.0000 mg | ORAL_TABLET | ORAL | Status: DC | PRN

## 2021-09-22 MED ORDER — CHLORHEXIDINE GLUCONATE CLOTH 2 % EX PADS: 6.0000 | MEDICATED_PAD | Freq: Once | CUTANEOUS | Status: DC

## 2021-09-22 MED ORDER — ACETAMINOPHEN 650 MG RE SUPP: 650.0000 mg | RECTAL | Status: DC | PRN

## 2021-09-22 MED ORDER — BUPIVACAINE LIPOSOME 1.3 % IJ SUSP
INTRAMUSCULAR | Status: AC
  Filled 2021-09-22: qty 20

## 2021-09-22 MED ORDER — BISACODYL 10 MG RE SUPP: 10.0000 mg | Freq: Every day | RECTAL | Status: DC | PRN

## 2021-09-22 MED ORDER — LABETALOL HCL 5 MG/ML IV SOLN
INTRAVENOUS | Status: DC | PRN
  Administered 2021-09-22: 10 mg via INTRAVENOUS

## 2021-09-22 MED ORDER — FENTANYL CITRATE (PF) 250 MCG/5ML IJ SOLN
INTRAMUSCULAR | Status: AC
  Filled 2021-09-22: qty 5

## 2021-09-22 MED ORDER — ONDANSETRON HCL 4 MG PO TABS: 4.0000 mg | ORAL_TABLET | Freq: Four times a day (QID) | ORAL | Status: DC | PRN

## 2021-09-22 MED ORDER — BUPIVACAINE LIPOSOME 1.3 % IJ SUSP
INTRAMUSCULAR | Status: DC | PRN
  Administered 2021-09-22: 20 mL

## 2021-09-22 MED ORDER — VENLAFAXINE HCL ER 75 MG PO CP24: 75.0000 mg | ORAL_CAPSULE | Freq: Every day | ORAL | Status: DC

## 2021-09-22 MED ORDER — ROCURONIUM BROMIDE 10 MG/ML (PF) SYRINGE
PREFILLED_SYRINGE | INTRAVENOUS | Status: DC | PRN
  Administered 2021-09-22: 50 mg via INTRAVENOUS

## 2021-09-22 MED ORDER — THROMBIN 5000 UNITS EX SOLR
CUTANEOUS | Status: AC
  Filled 2021-09-22: qty 5000

## 2021-09-22 MED ORDER — EPHEDRINE 5 MG/ML INJ
INTRAVENOUS | Status: AC
  Filled 2021-09-22: qty 5

## 2021-09-22 MED ORDER — THROMBIN 5000 UNITS EX SOLR
CUTANEOUS | Status: AC
Start: 2021-09-22 — End: ?
  Filled 2021-09-22: qty 5000

## 2021-09-22 MED ORDER — MIDAZOLAM HCL 2 MG/2ML IJ SOLN
INTRAMUSCULAR | Status: AC
  Filled 2021-09-22: qty 2

## 2021-09-22 MED ORDER — OXYCODONE HCL 5 MG PO TABS
5.0000 mg | ORAL_TABLET | ORAL | Status: DC | PRN
  Administered 2021-09-23: 5 mg via ORAL
  Filled 2021-09-22: qty 1

## 2021-09-22 MED ORDER — MIDAZOLAM HCL 2 MG/2ML IJ SOLN: 0.5000 mg | Freq: Once | INTRAMUSCULAR | Status: DC | PRN

## 2021-09-22 MED ORDER — MIDAZOLAM HCL 2 MG/2ML IJ SOLN
INTRAMUSCULAR | Status: DC | PRN
  Administered 2021-09-22: 2 mg via INTRAVENOUS

## 2021-09-22 MED ORDER — CYCLOBENZAPRINE HCL 10 MG PO TABS: 10.0000 mg | ORAL_TABLET | Freq: Three times a day (TID) | ORAL | Status: DC | PRN

## 2021-09-22 MED ORDER — DOCUSATE SODIUM 100 MG PO CAPS
100.0000 mg | ORAL_CAPSULE | Freq: Two times a day (BID) | ORAL | Status: DC
  Administered 2021-09-22: 100 mg via ORAL
  Filled 2021-09-22: qty 1

## 2021-09-22 MED ORDER — BACITRACIN ZINC 500 UNIT/GM EX OINT
TOPICAL_OINTMENT | CUTANEOUS | Status: AC
  Filled 2021-09-22: qty 28.35

## 2021-09-22 MED ORDER — 0.9 % SODIUM CHLORIDE (POUR BTL) OPTIME
TOPICAL | Status: DC | PRN
  Administered 2021-09-22: 1000 mL

## 2021-09-22 MED ORDER — ACETAMINOPHEN 500 MG PO TABS: 1000.0000 mg | ORAL_TABLET | Freq: Once | ORAL | Status: AC

## 2021-09-22 MED ORDER — HYDROMORPHONE HCL 1 MG/ML IJ SOLN: 0.2500 mg | INTRAMUSCULAR | Status: DC | PRN

## 2021-09-22 MED ORDER — BUPIVACAINE-EPINEPHRINE (PF) 0.25% -1:200000 IJ SOLN
INTRAMUSCULAR | Status: AC
  Filled 2021-09-22: qty 30

## 2021-09-22 MED ORDER — LACTATED RINGERS IV SOLN: INTRAVENOUS | Status: DC

## 2021-09-22 MED ORDER — ONDANSETRON HCL 4 MG/2ML IJ SOLN
INTRAMUSCULAR | Status: AC
  Filled 2021-09-22: qty 4

## 2021-09-22 MED ORDER — OXYCODONE HCL 5 MG PO TABS
10.0000 mg | ORAL_TABLET | ORAL | Status: DC | PRN
  Administered 2021-09-22: 10 mg via ORAL
  Filled 2021-09-22: qty 2

## 2021-09-22 MED ORDER — ACETAMINOPHEN 500 MG PO TABS
ORAL_TABLET | ORAL | Status: AC
  Administered 2021-09-22: 1000 mg via ORAL
  Filled 2021-09-22: qty 2

## 2021-09-22 MED ORDER — THROMBIN 5000 UNITS EX SOLR: OROMUCOSAL | Status: DC | PRN

## 2021-09-22 MED ORDER — SODIUM CHLORIDE 0.9% FLUSH: 3.0000 mL | INTRAVENOUS | Status: DC | PRN

## 2021-09-22 MED ORDER — HYDROMORPHONE HCL 1 MG/ML IJ SOLN
INTRAMUSCULAR | Status: AC
  Filled 2021-09-22: qty 0.5

## 2021-09-22 MED ORDER — ROSUVASTATIN CALCIUM 5 MG PO TABS: 10.0000 mg | ORAL_TABLET | Freq: Every day | ORAL | Status: DC

## 2021-09-22 MED ORDER — CEFAZOLIN SODIUM-DEXTROSE 2-4 GM/100ML-% IV SOLN
2.0000 g | INTRAVENOUS | Status: AC
  Administered 2021-09-22: 2 g via INTRAVENOUS

## 2021-09-22 MED ORDER — CEFAZOLIN SODIUM-DEXTROSE 2-4 GM/100ML-% IV SOLN
INTRAVENOUS | Status: AC
  Administered 2021-09-22: 2 g via INTRAVENOUS
  Filled 2021-09-22: qty 100

## 2021-09-22 MED ORDER — LIDOCAINE 2% (20 MG/ML) 5 ML SYRINGE
INTRAMUSCULAR | Status: AC
  Filled 2021-09-22: qty 5

## 2021-09-22 MED ORDER — ORAL CARE MOUTH RINSE: 15.0000 mL | Freq: Once | OROMUCOSAL | Status: AC

## 2021-09-22 MED ORDER — DEXAMETHASONE SODIUM PHOSPHATE 10 MG/ML IJ SOLN
INTRAMUSCULAR | Status: AC
  Filled 2021-09-22: qty 2

## 2021-09-22 MED ORDER — PHENOL 1.4 % MT LIQD: 1.0000 | OROMUCOSAL | Status: DC | PRN

## 2021-09-22 MED ORDER — MORPHINE SULFATE (PF) 4 MG/ML IV SOLN: 4.0000 mg | INTRAVENOUS | Status: DC | PRN

## 2021-09-22 MED ORDER — PHENYLEPHRINE HCL-NACL 20-0.9 MG/250ML-% IV SOLN
INTRAVENOUS | Status: DC | PRN
  Administered 2021-09-22: 80 ug via INTRAVENOUS

## 2021-09-22 MED ORDER — PROPOFOL 10 MG/ML IV BOLUS
INTRAVENOUS | Status: DC | PRN
  Administered 2021-09-22: 180 mg via INTRAVENOUS

## 2021-09-22 MED ORDER — SUGAMMADEX SODIUM 200 MG/2ML IV SOLN
INTRAVENOUS | Status: DC | PRN
  Administered 2021-09-22: 200 mg via INTRAVENOUS

## 2021-09-22 MED ORDER — CHLORHEXIDINE GLUCONATE 0.12 % MT SOLN
OROMUCOSAL | Status: AC
  Administered 2021-09-22: 15 mL via OROMUCOSAL
  Filled 2021-09-22: qty 15

## 2021-09-22 MED ORDER — OXYCODONE HCL 5 MG/5ML PO SOLN: 5.0000 mg | Freq: Once | ORAL | Status: DC | PRN

## 2021-09-22 MED ORDER — CEFAZOLIN SODIUM-DEXTROSE 2-4 GM/100ML-% IV SOLN
2.0000 g | Freq: Three times a day (TID) | INTRAVENOUS | Status: AC
  Administered 2021-09-23: 2 g via INTRAVENOUS
  Filled 2021-09-22 (×2): qty 100

## 2021-09-22 MED ORDER — HEMOSTATIC AGENTS (NO CHARGE) OPTIME
TOPICAL | Status: DC | PRN
  Administered 2021-09-22: 1 via TOPICAL

## 2021-09-22 MED ORDER — DEXAMETHASONE SODIUM PHOSPHATE 10 MG/ML IJ SOLN
INTRAMUSCULAR | Status: DC | PRN
  Administered 2021-09-22: 10 mg via INTRAVENOUS

## 2021-09-22 MED ORDER — ACETAMINOPHEN 500 MG PO TABS
1000.0000 mg | ORAL_TABLET | Freq: Four times a day (QID) | ORAL | Status: DC
  Administered 2021-09-22 – 2021-09-23 (×2): 1000 mg via ORAL
  Filled 2021-09-22 (×3): qty 2

## 2021-09-22 MED ORDER — CHLORHEXIDINE GLUCONATE 0.12 % MT SOLN: 15.0000 mL | Freq: Once | OROMUCOSAL | Status: AC

## 2021-09-22 MED ORDER — PROMETHAZINE HCL 25 MG/ML IJ SOLN: 6.2500 mg | INTRAMUSCULAR | Status: DC | PRN

## 2021-09-22 MED ORDER — LABETALOL HCL 5 MG/ML IV SOLN
INTRAVENOUS | Status: AC
  Filled 2021-09-22: qty 4

## 2021-09-22 MED ORDER — FENTANYL CITRATE (PF) 250 MCG/5ML IJ SOLN
INTRAMUSCULAR | Status: DC | PRN
Start: 2021-09-22 — End: 2021-09-22
  Administered 2021-09-22: 100 ug via INTRAVENOUS
  Administered 2021-09-22 (×3): 50 ug via INTRAVENOUS

## 2021-09-22 MED ORDER — BACITRACIN ZINC 500 UNIT/GM EX OINT
TOPICAL_OINTMENT | CUTANEOUS | Status: DC | PRN
  Administered 2021-09-22: 1 via TOPICAL

## 2021-09-22 MED ORDER — BUPIVACAINE-EPINEPHRINE (PF) 0.5% -1:200000 IJ SOLN
INTRAMUSCULAR | Status: DC | PRN
  Administered 2021-09-22: 10 mL

## 2021-09-22 MED ORDER — OXYCODONE HCL 5 MG PO TABS: 5.0000 mg | ORAL_TABLET | Freq: Once | ORAL | Status: DC | PRN

## 2021-09-22 MED ORDER — MENTHOL 3 MG MT LOZG: 1.0000 | LOZENGE | OROMUCOSAL | Status: DC | PRN

## 2021-09-22 MED ORDER — MEPERIDINE HCL 25 MG/ML IJ SOLN: 6.2500 mg | INTRAMUSCULAR | Status: DC | PRN

## 2021-09-22 MED ORDER — IBUPROFEN 200 MG PO TABS: 400.0000 mg | ORAL_TABLET | Freq: Four times a day (QID) | ORAL | Status: DC | PRN

## 2021-09-22 MED ORDER — SODIUM CHLORIDE 0.9 % IV SOLN
250.0000 mL | INTRAVENOUS | Status: DC
  Administered 2021-09-22: 250 mL via INTRAVENOUS

## 2021-09-22 MED ORDER — ONDANSETRON HCL 4 MG/2ML IJ SOLN
4.0000 mg | Freq: Four times a day (QID) | INTRAMUSCULAR | Status: DC | PRN
  Filled 2021-09-22: qty 2

## 2021-09-22 SURGICAL SUPPLY — 47 items
BAG COUNTER SPONGE SURGICOUNT (BAG) ×1 IMPLANT
BAND RUBBER #18 3X1/16 STRL (MISCELLANEOUS) ×2 IMPLANT
BENZOIN TINCTURE AMPULE (MISCELLANEOUS) IMPLANT
BENZOIN TINCTURE PRP APPL 2/3 (GAUZE/BANDAGES/DRESSINGS) ×1 IMPLANT
BLADE CLIPPER SURG (BLADE) IMPLANT
BUR MATCHSTICK NEURO 3.0 LAGG (BURR) ×1 IMPLANT
BUR PRECISION FLUTE 6.0 (BURR) ×1 IMPLANT
CANISTER SUCT 3000ML PPV (MISCELLANEOUS) ×1 IMPLANT
CARTRIDGE OIL MAESTRO DRILL (MISCELLANEOUS) ×1 IMPLANT
DIFFUSER DRILL AIR PNEUMATIC (MISCELLANEOUS) ×1 IMPLANT
DRAPE LAPAROTOMY 100X72X124 (DRAPES) ×1 IMPLANT
DRAPE MICROSCOPE LEICA (MISCELLANEOUS) ×1 IMPLANT
DRAPE SURG 17X23 STRL (DRAPES) ×4 IMPLANT
DRSG OPSITE POSTOP 4X6 (GAUZE/BANDAGES/DRESSINGS) IMPLANT
ELECT BLADE 4.0 EZ CLEAN MEGAD (MISCELLANEOUS) ×1
ELECT REM PT RETURN 9FT ADLT (ELECTROSURGICAL) ×1
ELECTRODE BLDE 4.0 EZ CLN MEGD (MISCELLANEOUS) ×1 IMPLANT
ELECTRODE REM PT RTRN 9FT ADLT (ELECTROSURGICAL) ×1 IMPLANT
GAUZE 4X4 16PLY ~~LOC~~+RFID DBL (SPONGE) IMPLANT
GAUZE SPONGE 4X4 12PLY STRL (GAUZE/BANDAGES/DRESSINGS) ×1 IMPLANT
GLOVE BIO SURGEON STRL SZ8 (GLOVE) ×1 IMPLANT
GLOVE BIO SURGEON STRL SZ8.5 (GLOVE) ×1 IMPLANT
GLOVE EXAM NITRILE XL STR (GLOVE) IMPLANT
GOWN STRL REUS W/ TWL LRG LVL3 (GOWN DISPOSABLE) IMPLANT
GOWN STRL REUS W/ TWL XL LVL3 (GOWN DISPOSABLE) ×1 IMPLANT
GOWN STRL REUS W/TWL 2XL LVL3 (GOWN DISPOSABLE) IMPLANT
GOWN STRL REUS W/TWL LRG LVL3 (GOWN DISPOSABLE) ×3
GOWN STRL REUS W/TWL XL LVL3 (GOWN DISPOSABLE) ×1
KIT BASIN OR (CUSTOM PROCEDURE TRAY) ×1 IMPLANT
KIT TURNOVER KIT B (KITS) ×1 IMPLANT
NDL HYPO 21X1.5 SAFETY (NEEDLE) IMPLANT
NEEDLE HYPO 21X1.5 SAFETY (NEEDLE) ×1 IMPLANT
NEEDLE HYPO 22GX1.5 SAFETY (NEEDLE) ×1 IMPLANT
NS IRRIG 1000ML POUR BTL (IV SOLUTION) ×1 IMPLANT
OIL CARTRIDGE MAESTRO DRILL (MISCELLANEOUS) ×1
PACK LAMINECTOMY NEURO (CUSTOM PROCEDURE TRAY) ×1 IMPLANT
PAD ARMBOARD 7.5X6 YLW CONV (MISCELLANEOUS) ×3 IMPLANT
PATTIES SURGICAL .5 X1 (DISPOSABLE) IMPLANT
SPONGE SURGIFOAM ABS GEL SZ50 (HEMOSTASIS) ×1 IMPLANT
STRIP CLOSURE SKIN 1/2X4 (GAUZE/BANDAGES/DRESSINGS) ×1 IMPLANT
SUT VIC AB 1 CT1 18XBRD ANBCTR (SUTURE) ×2 IMPLANT
SUT VIC AB 1 CT1 8-18 (SUTURE) ×2
SUT VIC AB 2-0 CP2 18 (SUTURE) ×2 IMPLANT
SYR 20ML LL LF (SYRINGE) IMPLANT
TOWEL GREEN STERILE (TOWEL DISPOSABLE) ×1 IMPLANT
TOWEL GREEN STERILE FF (TOWEL DISPOSABLE) ×1 IMPLANT
WATER STERILE IRR 1000ML POUR (IV SOLUTION) ×1 IMPLANT

## 2021-09-22 NOTE — Transfer of Care (Signed)
Immediate Anesthesia Transfer of Care Note  Patient: Nathan Sharp  Procedure(s) Performed: LUMBAR BILATERAL Lumbar Two-Three, Lumbar Three-Four, Lumbar Four-Five LAMINOTOMY, FORAMINOTOMY (Bilateral)  Patient Location: PACU  Anesthesia Type:General  Level of Consciousness: awake, oriented and drowsy  Airway & Oxygen Therapy: Patient Spontanous Breathing and Patient connected to nasal cannula oxygen  Post-op Assessment: Report given to RN, Post -op Vital signs reviewed and stable and Patient moving all extremities X 4  Post vital signs: Reviewed and stable  Last Vitals:  Vitals Value Taken Time  BP 134/91 09/22/21 1813  Temp 36.4 C 09/22/21 1813  Pulse 92 09/22/21 1815  Resp 17 09/22/21 1815  SpO2 95 % 09/22/21 1815  Vitals shown include unvalidated device data.  Last Pain:  Vitals:   09/22/21 1341  TempSrc:   PainSc: 0-No pain         Complications: No notable events documented.

## 2021-09-22 NOTE — H&P (Signed)
Subjective: The patient is a 61 year old white male who is complained of back and right greater left leg pain consistent with neurogenic claudication.  He has failed medical management and was worked up with a lumbar MRI and a lumbar myelo CT.  This demonstrated spinal stenosis.  I discussed the various treatment options with him.  He has decided proceed with surgery.  Past Medical History:  Diagnosis Date   Depression    High cholesterol     Past Surgical History:  Procedure Laterality Date   WISDOM TOOTH EXTRACTION      No Known Allergies  Social History   Tobacco Use   Smoking status: Never   Smokeless tobacco: Never  Substance Use Topics   Alcohol use: Yes    Alcohol/week: 2.0 standard drinks of alcohol    Types: 2 Standard drinks or equivalent per week    History reviewed. No pertinent family history. Prior to Admission medications   Medication Sig Start Date End Date Taking? Authorizing Provider  desvenlafaxine (PRISTIQ) 50 MG 24 hr tablet Take 50 mg by mouth daily. 06/04/21  Yes [provider]  ibuprofen (ADVIL) 200 MG tablet Take 400 mg by mouth every 6 (six) hours as needed for moderate pain.   Yes [provider]  rosuvastatin (CRESTOR) 10 MG tablet Take 10 mg by mouth daily. 06/04/21  Yes [provider]     Review of Systems  Positive ROS: As above  All other systems have been reviewed and were otherwise negative with the exception of those mentioned in the HPI and as above.  Objective: Vital signs in last 24 hours: Temp:  [97.9 F (36.6 C)] 97.9 F (36.6 C) (08/21 1331) Pulse Rate:  [80] 80 (08/21 1330) Resp:  [18] 18 (08/21 1330) BP: (140-144)/(98-104) 140/98 (08/21 1341) SpO2:  [97 %] 97 % (08/21 1330) Weight:  [71.7 kg] 71.7 kg (08/21 1330) Estimated body mass index is 27.99 kg/m as calculated from the following:   Height as of this encounter: 5\' 3"  (1.6 m).   Weight as of this encounter: 71.7 kg.   General Appearance:  Alert Head: Normocephalic, without obvious abnormality, atraumatic Eyes: PERRL, conjunctiva/corneas clear, EOM's intact,    Ears: Normal  Throat: Normal  Neck: Supple, Back: unremarkable Lungs: Clear to auscultation bilaterally, respirations unlabored Heart: Regular rate and rhythm, no murmur, rub or gallop Abdomen: Soft, non-tender Extremities: Extremities normal, atraumatic, no cyanosis or edema Skin: unremarkable  NEUROLOGIC:   Mental status: alert and oriented,Motor Exam - grossly normal Sensory Exam - grossly normal Reflexes:  Coordination - grossly normal Gait - grossly normal Balance - grossly normal Cranial Nerves: I: smell Not tested  II: visual acuity  OS: Normal  OD: Normal   II: visual fields Full to confrontation  II: pupils Equal, round, reactive to light  III,VII: ptosis None  III,IV,VI: extraocular muscles  Full ROM  V: mastication Normal  V: facial light touch sensation  Normal  V,VII: corneal reflex  Present  VII: facial muscle function - upper  Normal  VII: facial muscle function - lower Normal  VIII: hearing Not tested  IX: soft palate elevation  Normal  IX,X: gag reflex Present  XI: trapezius strength  5/5  XI: sternocleidomastoid strength 5/5  XI: neck flexion strength  5/5  XII: tongue strength  Normal    Data Review Lab Results  Component Value Date   WBC 10.7 (H) 09/18/2021   HGB 15.1 09/18/2021   HCT 46.5 09/18/2021   MCV 87.9  09/18/2021   PLT 247 09/18/2021   No results found for: "NA", "K", "CL", "CO2", "BUN", "CREATININE", "GLUCOSE" No results found for: "INR", "PROTIME"  Assessment/Plan: Lumbar spinal stenosis, lumbago, lumbar radiculopathy, neurogenic claudication: I have discussed the situation with the patient.  I reviewed his imaging studies with him and pointed out the abnormalities.  We have discussed the various treatment options including surgery.  I have described the surgical treatment option of a bilateral L2-3, L3-4 and  L4-5 laminotomy/foraminotomies.  I have shown him surgical models.  I have given him a surgical pamphlet.  We have discussed the risk, benefits, alternatives, expected postoperative course, and likelihood of achieving our goals with surgery.  I have answered all his questions.  He has decided proceed with surgery.   Nathan Sharp 09/22/2021 2:59 PM

## 2021-09-22 NOTE — Anesthesia Postprocedure Evaluation (Signed)
Anesthesia Post Note  Patient: Nathan Sharp  Procedure(s) Performed: LUMBAR BILATERAL Lumbar Two-Three, Lumbar Three-Four, Lumbar Four-Five LAMINOTOMY, FORAMINOTOMY (Bilateral)     Patient location during evaluation: PACU Anesthesia Type: General Level of consciousness: awake Pain management: pain level controlled Vital Signs Assessment: post-procedure vital signs reviewed and stable Respiratory status: spontaneous breathing Cardiovascular status: stable Postop Assessment: no apparent nausea or vomiting Anesthetic complications: no   No notable events documented.  Last Vitals:  Vitals:   09/22/21 1813 09/22/21 1828  BP: (!) 134/91 130/78  Pulse: 87 80  Resp: 13 14  Temp: 36.4 C   SpO2: 98% 97%    Last Pain:  Vitals:   09/22/21 1813  TempSrc:   PainSc: 0-No pain                 Amarilys Lyles

## 2021-09-22 NOTE — Anesthesia Procedure Notes (Signed)
Procedure Name: Intubation Date/Time: 09/22/2021 3:43 PM  Performed by: Anastasio Auerbach, CRNAPre-anesthesia Checklist: Patient identified, Emergency Drugs available, Suction available and Patient being monitored Patient Re-evaluated:Patient Re-evaluated prior to induction Oxygen Delivery Method: Circle system utilized Preoxygenation: Pre-oxygenation with 100% oxygen Induction Type: IV induction Ventilation: Mask ventilation without difficulty Laryngoscope Size: Mac and 3 Grade View: Grade I Tube type: Oral Tube size: 7.5 mm Number of attempts: 1 Airway Equipment and Method: Stylet Placement Confirmation: ETT inserted through vocal cords under direct vision, positive ETCO2 and breath sounds checked- equal and bilateral Secured at: 23 cm Tube secured with: Tape Dental Injury: Teeth and Oropharynx as per pre-operative assessment

## 2021-09-22 NOTE — Op Note (Signed)
Brief history: The patient is a 61 year old white male who is complained of back and right greater left leg pain consistent with neurogenic claudication/lumbar radiculopathy.  He has failed medical management.  He was worked up with a lumbar MRI lumbar x-rays and a lumbar myelo CT.  This demonstrated spinal stenosis at L2-3, 3 4 and L4-5.  I discussed the various treatment options with him.  He has decided proceed with surgery.  Preoperative diagnosis: Lumbar spinal stenosis, neurogenic claudication, lumbar radiculopathy, lumbago  Postoperative diagnosis: The same  Procedure: Bilateral L2-3, L3-4 and L4-5 laminotomy/foraminotomies  using micro-dissection  Surgeon: Dr. Delma Officer  Asst.: Hildred Priest, NP  Anesthesia: Gen. endotracheal  Estimated blood loss: 100 cc  Drains: None  Complications: None  Description of procedure: The patient was brought to the operating room by the anesthesia team. General endotracheal anesthesia was induced. The patient was turned to the prone position on the Wilson frame. The patient's lumbosacral region was then prepared with Betadine scrub and Betadine solution. Sterile drapes were applied.  I then injected the area to be incised with Marcaine with epinephrine solution. I then used a scalpel to make a linear midline incision over the L2-3, L3-4 and L4-5 intervertebral disc space. I then used electrocautery to perform a right sided subperiosteal dissection exposing the spinous process and lamina of L2-3, L3-4 and L4-5. We obtained intraoperative radiograph to confirm our location. I then inserted the [facility] retractor for exposure.  We then brought the operative microscope into the field. Under its magnification and illumination we completed the microdissection. I used a high-speed drill to perform a laminotomy at L2-3, L3-4 and L4-5. I then used a Kerrison punches to widen the laminotomy and removed the ligamentum flavum at L2-3, L3-4 and L4-5 on the  right. We then used microdissection to free up the thecal sac and the right L3, L4 and L5 nerve root from the epidural tissue. I then used a Kerrison punch to perform a foraminotomy at about the L3, L4 and L5 nerve root.  I then used a high-speed drill to drill across the midline and performed a left L2-3, L3-4 and L4-5 laminotomy.  I then used a Kerrison punch to remove the ligamentum flavum on the left at L2-3, L3-4 and L4-5.  We then performed foraminotomies about the left L3, L4 and L5 nerve root.  We inspected the ruptured disc at L2-3, L3-4 and L4-5.  There were no significant herniations.  I then palpated along the ventral surface of the thecal sac and along exit route of the bilateral L3, L4 and L5 nerve root and noted that the neural structures were well decompressed. This completed the decompression.  We then obtained hemostasis using bipolar electrocautery. We irrigated the wound out with saline solution. We then removed the retractor. We then reapproximated the patient's thoracolumbar fascia with interrupted #1 Vicryl suture. We then reapproximated the patient's subcutaneous tissue with interrupted 2-0 Vicryl suture. We then reapproximated patient's skin with Steri-Strips and benzoin. The was then coated with bacitracin ointment. The drapes were removed. The patient was subsequently returned to the supine position where they were extubated by the anesthesia team. The patient was then transported to the postanesthesia care unit in stable condition. All sponge instrument and needle counts were reportedly correct at the end of this case.

## 2021-09-23 ENCOUNTER — Encounter (HOSPITAL_COMMUNITY): Payer: Self-pay | Admitting: Neurosurgery

## 2021-09-23 DIAGNOSIS — M48062 Spinal stenosis, lumbar region with neurogenic claudication: Secondary | ICD-10-CM | POA: Diagnosis not present

## 2021-09-23 DIAGNOSIS — M5416 Radiculopathy, lumbar region: Secondary | ICD-10-CM | POA: Diagnosis not present

## 2021-09-23 MED ORDER — OXYCODONE-ACETAMINOPHEN 5-325 MG PO TABS
1.0000 | ORAL_TABLET | ORAL | Status: DC | PRN
  Administered 2021-09-23: 2 via ORAL
  Filled 2021-09-23: qty 2

## 2021-09-23 MED ORDER — CYCLOBENZAPRINE HCL 10 MG PO TABS: 10.0000 mg | ORAL_TABLET | Freq: Three times a day (TID) | ORAL | 0 refills | Status: DC | PRN

## 2021-09-23 MED ORDER — OXYCODONE-ACETAMINOPHEN 5-325 MG PO TABS: 1.0000 | ORAL_TABLET | ORAL | 0 refills | Status: DC | PRN

## 2021-09-23 MED ORDER — DOCUSATE SODIUM 100 MG PO CAPS: 100.0000 mg | ORAL_CAPSULE | Freq: Two times a day (BID) | ORAL | 0 refills | Status: DC

## 2021-09-23 NOTE — Discharge Summary (Signed)
Physician Discharge Summary  Patient ID: Nathan Sharp MRN: [id number] DOB/AGE: [birth year] 61 y.o.  Admit date: 09/22/2021 Discharge date: 09/23/2021  Admission Diagnoses: Lumbar spinal stenosis, lumbago, neurogenic claudication, lumbar radiculopathy  Discharge Diagnoses: The same Principal Problem:   Spinal stenosis of lumbar region with neurogenic claudication   Discharged Condition: good  Hospital Course: I performed bilateral L2-3, L3-4 and L4-5 laminotomy/foraminotomies on the patient on 09/22/2021.  The surgery went well.  The patient's postoperative course was unremarkable.  On postoperative day #1 he felt much better and requested discharge to home.  The patient, and his wife, were given verbal and written discharge instructions.  All their questions were answered.  Consults: PT, OT, care management Significant Diagnostic Studies: None Treatments: Bilateral L2-3, L3-4 and L4-5 laminotomy/foraminotomies using microdissection Discharge Exam: Blood pressure 107/83, pulse 72, temperature 97.7 F (36.5 C), temperature source Oral, resp. rate 18, height 5\' 3"  (1.6 m), weight 71.7 kg, SpO2 96 %. The patient is alert and pleasant.  He looks well.  His strength is normal.  Disposition: Home  Discharge Instructions     Call MD for:  difficulty breathing, headache or visual disturbances   Complete by: As directed    Call MD for:  extreme fatigue   Complete by: As directed    Call MD for:  hives   Complete by: As directed    Call MD for:  persistant dizziness or light-headedness   Complete by: As directed    Call MD for:  persistant nausea and vomiting   Complete by: As directed    Call MD for:  redness, tenderness, or signs of infection (pain, swelling, redness, odor or green/yellow discharge around incision site)   Complete by: As directed    Call MD for:  severe uncontrolled pain   Complete by: As directed    Call MD for:  temperature >100.4   Complete by: As directed     Diet - low sodium heart healthy   Complete by: As directed    Discharge instructions   Complete by: As directed    Call [phone] for a followup appointment. Take a stool softener while you are using pain medications.   Driving Restrictions   Complete by: As directed    Do not drive for 2 weeks.   Increase activity slowly   Complete by: As directed    Lifting restrictions   Complete by: As directed    Do not lift more than 5 pounds. No excessive bending or twisting.   May shower / Bathe   Complete by: As directed    Remove the dressing for 3 days after surgery.  You may shower, but leave the incision alone.   Remove dressing in 48 hours   Complete by: As directed       Allergies as of 09/23/2021   No Known Allergies      Medication List     TAKE these medications    cyclobenzaprine 10 MG tablet Commonly known as: FLEXERIL Take 1 tablet (10 mg total) by mouth 3 (three) times daily as needed for muscle spasms.   desvenlafaxine 50 MG 24 hr tablet Commonly known as: PRISTIQ Take 50 mg by mouth daily.   docusate sodium 100 MG capsule Commonly known as: COLACE Take 1 capsule (100 mg total) by mouth 2 (two) times daily.   ibuprofen 200 MG tablet Commonly known as: ADVIL Take 400 mg by mouth every 6 (six) hours as needed for moderate pain.   oxyCODONE-acetaminophen  5-325 MG tablet Commonly known as: PERCOCET/ROXICET Take 1-2 tablets by mouth every 4 (four) hours as needed for moderate pain.   rosuvastatin 10 MG tablet Commonly known as: CRESTOR Take 10 mg by mouth daily.         Signed: [name] 09/23/2021, 7:45 AM

## 2021-09-23 NOTE — Plan of Care (Signed)
Pt doing well. Pt and wife given D/C instructions with verbal understanding. Rx's were sent to the pharmacy by MD. Pt's incision is clean and dry with no sign of infection. Pt's IV was removed prior to D/C. Pt D/C'd home via wheelchair per MD order. Pt is stable @ D/C and has no other needs at this time. Niklas Chretien, RN  

## 2021-09-23 NOTE — Progress Notes (Signed)
PT Cancellation Note  Patient Details Name: Nathan Sharp MRN: [id number] DOB: [birth year]   Cancelled Treatment:    Reason Eval/Treat Not Completed: PT screened, no needs identified, will sign off Per OT, pt functioning at Mod I level and does not require PT evaluation/services. Signing off. Thanks.   Marcy Panning 09/23/2021, 8:47 AM [place], PT, DPT Acute Rehabilitation Services Secure chat preferred Office [phone]

## 2021-09-23 NOTE — Evaluation (Signed)
Occupational Therapy Evaluation Patient Details Name: Nathan Sharp MRN: [id number] DOB: 1960-12-30 Today's Date: 09/23/2021   History of Present Illness Patient is a 61 y/o male who presents s/p bilateral L2-3, 3-4, 4-5 laminotomy and foraminotomy 09/22/21. PMH includes depression and high cholesterol.   Clinical Impression   Patient evaluated by Occupational Therapy with no further acute OT needs identified. All education has been completed and the patient has no further questions. See below for any follow-up Occupational Therapy or equipment needs. OT to sign off. Thank you for referral.        Recommendations for follow up therapy are one component of a multi-disciplinary discharge planning process, led by the attending physician.  Recommendations may be updated based on patient status, additional functional criteria and insurance authorization.   Follow Up Recommendations  No OT follow up    Assistance Recommended at Discharge None  Patient can return home with the following      Functional Status Assessment  Patient has had a recent decline in their functional status and demonstrates the ability to make significant improvements in function in a reasonable and predictable amount of time.  Equipment Recommendations  None recommended by OT    Recommendations for Other Services       Precautions / Restrictions Precautions Precautions: Back Precaution Booklet Issued: Yes (comment) Precaution Comments: Reviewed back precautions and handout Restrictions Weight Bearing Restrictions: No      Mobility Bed Mobility Overal bed mobility: Independent                  Transfers Overall transfer level: Independent                        Balance Overall balance assessment: Modified Independent                                         ADL either performed or assessed with clinical judgement   ADL Overall ADL's : Modified independent                                        General ADL Comments: dressed on arrival. able to figure 4 for LB dressing. pt able to demonstrate stair transfer. pt has assist of wife Elnita Maxwell if needed. reviewed all back precautions with adls   Back handout provided and reviewed adls in detail. Pt educated on:  set an alarm at night for medication, avoid sitting for long periods of time, correct bed positioning for sleeping, correct sequence for bed mobility, avoiding lifting more than 5 pounds and never wash directly over incision. All education is complete and patient indicates understanding.   Vision Baseline Vision/History: 1 Wears glasses Ability to See in Adequate Light: 0 Adequate Patient Visual Report: No change from baseline       Perception     Praxis      Pertinent Vitals/Pain Pain Assessment Pain Assessment: 0-10 Pain Score: 2  Pain Location: back Pain Descriptors / Indicators: Sore Pain Intervention(s): Limited activity within patient's tolerance, Monitored during session, Premedicated before session, Repositioned     Hand Dominance Right   Extremity/Trunk Assessment Upper Extremity Assessment Upper Extremity Assessment: Overall WFL for tasks assessed   Lower Extremity Assessment Lower Extremity Assessment: Overall WFL for tasks assessed  Cervical / Trunk Assessment Cervical / Trunk Assessment: Back Surgery   Communication Communication Communication: No difficulties   Cognition Arousal/Alertness: Awake/alert Behavior During Therapy: WFL for tasks assessed/performed Overall Cognitive Status: Within Functional Limits for tasks assessed                                       General Comments  incision is dry and intact    Exercises     Shoulder Instructions      Home Living Family/patient expects to be discharged to:: Private residence Living Arrangements: Spouse/significant other Available Help at Discharge: Family;Available  PRN/intermittently Type of Home: House Home Access: Level entry     Home Layout: Two level;Able to live on main level with bedroom/bathroom;Bed/bath upstairs     Bathroom Shower/Tub: Producer, television/film/video: Standard     Home Equipment: None   Additional Comments: 2 dogs, wife works      Prior Functioning/Environment Prior Level of Function : Independent/Modified Independent;Other (comment) (retired)                        OT Problem List:        OT Treatment/Interventions:      OT Goals(Current goals can be found in the care plan section) Acute Rehab OT Goals Patient Stated Goal: woud like to be able to go home today Potential to Achieve Goals: Good  OT Frequency:      Co-evaluation              AM-PAC OT "6 Clicks" Daily Activity     Outcome Measure Help from another person eating meals?: None Help from another person taking care of personal grooming?: None Help from another person toileting, which includes using toliet, bedpan, or urinal?: None Help from another person bathing (including washing, rinsing, drying)?: None Help from another person to put on and taking off regular upper body clothing?: None Help from another person to put on and taking off regular lower body clothing?: None 6 Click Score: 24   End of Session Nurse Communication: Mobility status;Precautions  Activity Tolerance: Patient tolerated treatment well Patient left: in bed;with call bell/phone within reach;with family/visitor present  OT Visit Diagnosis: Muscle weakness (generalized) (M62.81)                Time: 9518-8416 OT Time Calculation (min): 25 min Charges:  OT General Charges $OT Visit: 1 Visit OT Evaluation $OT Eval Moderate Complexity: 1 Mod   Brynn, OTR/L  Acute Rehabilitation Services Office: [phone] .   Mateo Flow 09/23/2021, 8:45 AM

## 2021-10-17 DIAGNOSIS — M5416 Radiculopathy, lumbar region: Secondary | ICD-10-CM | POA: Diagnosis not present

## 2021-12-16 DIAGNOSIS — Z125 Encounter for screening for malignant neoplasm of prostate: Secondary | ICD-10-CM | POA: Diagnosis not present

## 2021-12-16 DIAGNOSIS — R739 Hyperglycemia, unspecified: Secondary | ICD-10-CM | POA: Diagnosis not present

## 2021-12-16 DIAGNOSIS — E7800 Pure hypercholesterolemia, unspecified: Secondary | ICD-10-CM | POA: Diagnosis not present

## 2021-12-22 DIAGNOSIS — D126 Benign neoplasm of colon, unspecified: Secondary | ICD-10-CM | POA: Diagnosis not present

## 2021-12-22 DIAGNOSIS — Z1211 Encounter for screening for malignant neoplasm of colon: Secondary | ICD-10-CM | POA: Diagnosis not present

## 2021-12-22 DIAGNOSIS — Z23 Encounter for immunization: Secondary | ICD-10-CM | POA: Diagnosis not present

## 2021-12-22 DIAGNOSIS — R82998 Other abnormal findings in urine: Secondary | ICD-10-CM | POA: Diagnosis not present

## 2021-12-22 DIAGNOSIS — Z0000 Encounter for general adult medical examination without abnormal findings: Secondary | ICD-10-CM | POA: Diagnosis not present

## 2021-12-22 DIAGNOSIS — Z1331 Encounter for screening for depression: Secondary | ICD-10-CM | POA: Diagnosis not present

## 2021-12-22 DIAGNOSIS — Z1339 Encounter for screening examination for other mental health and behavioral disorders: Secondary | ICD-10-CM | POA: Diagnosis not present

## 2021-12-23 DIAGNOSIS — R972 Elevated prostate specific antigen [PSA]: Secondary | ICD-10-CM | POA: Diagnosis not present

## 2022-01-17 ENCOUNTER — Telehealth: Payer: BC Managed Care – PPO | Admitting: Nurse Practitioner

## 2022-01-17 DIAGNOSIS — R42 Dizziness and giddiness: Secondary | ICD-10-CM

## 2022-01-17 MED ORDER — MECLIZINE HCL 25 MG PO TABS: 25.0000 mg | ORAL_TABLET | Freq: Three times a day (TID) | ORAL | 0 refills | Status: DC | PRN

## 2022-01-17 NOTE — Patient Instructions (Signed)
  Gwyndolyn Saxon, thank you for joining Bennie Pierini, FNP for today's virtual visit.  While this provider is not your primary care provider (PCP), if your PCP is located in our provider database this encounter information will be shared with them immediately following your visit.   A [facility] MyChart account gives you access to today's visit and all your visits, tests, and labs performed at [facility] " click here if you don't have a [facility] MyChart account or go to [email or web address]  Consent: (Patient) Nathan Sharp provided verbal consent for this virtual visit at the beginning of the encounter.  Current Medications:  Current Outpatient Medications:    meclizine (ANTIVERT) 25 MG tablet, Take 1 tablet (25 mg total) by mouth 3 (three) times daily as needed for dizziness., Disp: 30 tablet, Rfl: 0   cyclobenzaprine (FLEXERIL) 10 MG tablet, Take 1 tablet (10 mg total) by mouth 3 (three) times daily as needed for muscle spasms., Disp: 30 tablet, Rfl: 0   desvenlafaxine (PRISTIQ) 50 MG 24 hr tablet, Take 50 mg by mouth daily., Disp: , Rfl:    docusate sodium (COLACE) 100 MG capsule, Take 1 capsule (100 mg total) by mouth 2 (two) times daily., Disp: 30 capsule, Rfl: 0   ibuprofen (ADVIL) 200 MG tablet, Take 400 mg by mouth every 6 (six) hours as needed for moderate pain., Disp: , Rfl:    oxyCODONE-acetaminophen (PERCOCET/ROXICET) 5-325 MG tablet, Take 1-2 tablets by mouth every 4 (four) hours as needed for moderate pain., Disp: 30 tablet, Rfl: 0   rosuvastatin (CRESTOR) 10 MG tablet, Take 10 mg by mouth daily., Disp: , Rfl:    Medications ordered in this encounter:  Meds ordered this encounter  Medications   meclizine (ANTIVERT) 25 MG tablet    Sig: Take 1 tablet (25 mg total) by mouth 3 (three) times daily as needed for dizziness.    Dispense:  30 tablet    Refill:  0    Order Specific Question:   Supervising Provider    Answer:   Merrilee Jansky  [id number]     *If you need refills on other medications prior to your next appointment, please contact your pharmacy*  Follow-Up: Call back or seek an in-person evaluation if the symptoms worsen or if the condition fails to improve as anticipated.  [facility] [phone]  Other Instructions    If you have been instructed to have an in-person evaluation today at a local Urgent Care facility, please use the link below. It will take you to a list of all of our available [facility], including address, phone number and hours of operation. Please do not delay care.  [facility]  If you or a family member do not have a primary care provider, use the link below to schedule a visit and establish care. When you choose a [facility] primary care physician or advanced practice provider, you gain a long-term partner in health. Find a Primary Care Provider  Learn more about [facility] in-office and virtual care options:  - Get Care Now

## 2022-01-17 NOTE — Progress Notes (Signed)
Virtual Visit Consent   Nathan Sharp, you are scheduled for a virtual visit with Mary-Margaret Daphine Deutscher, FNP, a [facility] provider, today.     Just as with appointments in the office, your consent must be obtained to participate.  Your consent will be active for this visit and any virtual visit you may have with one of our providers in the next 365 days.     If you have a MyChart account, a copy of this consent can be sent to you electronically.  All virtual visits are billed to your insurance company just like a traditional visit in the office.    As this is a virtual visit, video technology does not allow for your provider to perform a traditional examination.  This may limit your provider's ability to fully assess your condition.  If your provider identifies any concerns that need to be evaluated in person or the need to arrange testing (such as labs, EKG, etc.), we will make arrangements to do so.     Although advances in technology are sophisticated, we cannot ensure that it will always work on either your end or our end.  If the connection with a video visit is poor, the visit may have to be switched to a telephone visit.  With either a video or telephone visit, we are not always able to ensure that we have a secure connection.     I need to obtain your verbal consent now.   Are you willing to proceed with your visit today? YES   Nathan Sharp has provided verbal consent on 01/17/2022 for a virtual visit (video or telephone).   Mary-Margaret Daphine Deutscher, FNP   Date: 01/17/2022 2:06 PM   Virtual Visit via Video Note   I, Mary-Margaret Daphine Deutscher, connected with Nathan Sharp ([id number], 01/26/1961) on 01/17/22 at  2:15 PM EST by a video-enabled telemedicine application and verified that I am speaking with the correct person using two identifiers.  Location: Patient: Virtual Visit Location Patient: Home Provider: Virtual Visit Location Provider: Mobile   I discussed the  limitations of evaluation and management by telemedicine and the availability of in person appointments. The patient expressed understanding and agreed to proceed.    History of Present Illness: Nathan Sharp is a 61 y.o. who identifies as a male who was assigned male at birth, and is being seen today for vertigo   HPI: Dizziness The current episode started today. Associated symptoms include nausea and vomiting. Pertinent negatives include no chills or congestion. Exacerbated by: any movemnt aggravates. He has tried nothing for the symptoms. The treatment provided no relief.    Review of Systems  Constitutional:  Negative for chills.  HENT:  Negative for congestion.   Gastrointestinal:  Positive for nausea and vomiting.  Neurological:  Positive for dizziness.    Problems:  Patient Active Problem List   Diagnosis Date Noted   Spinal stenosis of lumbar region with neurogenic claudication 09/22/2021    Allergies: No Known Allergies Medications:  Current Outpatient Medications:    cyclobenzaprine (FLEXERIL) 10 MG tablet, Take 1 tablet (10 mg total) by mouth 3 (three) times daily as needed for muscle spasms., Disp: 30 tablet, Rfl: 0   desvenlafaxine (PRISTIQ) 50 MG 24 hr tablet, Take 50 mg by mouth daily., Disp: , Rfl:    docusate sodium (COLACE) 100 MG capsule, Take 1 capsule (100 mg total) by mouth 2 (two) times daily., Disp: 30 capsule, Rfl: 0   ibuprofen (ADVIL) 200  MG tablet, Take 400 mg by mouth every 6 (six) hours as needed for moderate pain., Disp: , Rfl:    oxyCODONE-acetaminophen (PERCOCET/ROXICET) 5-325 MG tablet, Take 1-2 tablets by mouth every 4 (four) hours as needed for moderate pain., Disp: 30 tablet, Rfl: 0   rosuvastatin (CRESTOR) 10 MG tablet, Take 10 mg by mouth daily., Disp: , Rfl:   Observations/Objective: Patient is well-developed, well-nourished in no acute distress.  Resting comfortably  at home.  Head is normocephalic, atraumatic.  No labored breathing.   Speech is clear and coherent with logical content.  Patient is alert and oriented at baseline.  Laying in bed  Assessment and Plan:  Nathan Sharp in today with chief complaint of Dizziness   1. Vertigo Force fluids Rest RTO prn  Meds ordered this encounter  Medications   meclizine (ANTIVERT) 25 MG tablet    Sig: Take 1 tablet (25 mg total) by mouth 3 (three) times daily as needed for dizziness.    Dispense:  30 tablet    Refill:  0    Order Specific Question:   Supervising Provider    Answer:   Merrilee Jansky [id number]         Follow Up Instructions: I discussed the assessment and treatment plan with the patient. The patient was provided an opportunity to ask questions and all were answered. The patient agreed with the plan and demonstrated an understanding of the instructions.  A copy of instructions were sent to the patient via MyChart.  The patient was advised to call back or seek an in-person evaluation if the symptoms worsen or if the condition fails to improve as anticipated.  Time:  I spent 9 minutes with the patient via telehealth technology discussing the above problems/concerns.    Mary-Margaret Daphine Deutscher, FNP

## 2022-01-20 DIAGNOSIS — R42 Dizziness and giddiness: Secondary | ICD-10-CM | POA: Diagnosis not present

## 2022-02-03 DIAGNOSIS — M48062 Spinal stenosis, lumbar region with neurogenic claudication: Secondary | ICD-10-CM | POA: Diagnosis not present

## 2022-02-03 DIAGNOSIS — Z6829 Body mass index (BMI) 29.0-29.9, adult: Secondary | ICD-10-CM | POA: Diagnosis not present

## 2022-02-04 DIAGNOSIS — Z1211 Encounter for screening for malignant neoplasm of colon: Secondary | ICD-10-CM | POA: Diagnosis not present

## 2022-02-11 DIAGNOSIS — R972 Elevated prostate specific antigen [PSA]: Secondary | ICD-10-CM | POA: Diagnosis not present

## 2022-02-17 ENCOUNTER — Other Ambulatory Visit: Payer: Self-pay | Admitting: Urology

## 2022-02-17 DIAGNOSIS — R972 Elevated prostate specific antigen [PSA]: Secondary | ICD-10-CM

## 2022-03-02 DIAGNOSIS — R972 Elevated prostate specific antigen [PSA]: Secondary | ICD-10-CM | POA: Diagnosis not present

## 2022-03-18 ENCOUNTER — Other Ambulatory Visit: Payer: BC Managed Care – PPO

## 2022-03-26 DIAGNOSIS — C61 Malignant neoplasm of prostate: Secondary | ICD-10-CM | POA: Diagnosis not present

## 2022-03-26 DIAGNOSIS — R972 Elevated prostate specific antigen [PSA]: Secondary | ICD-10-CM | POA: Diagnosis not present

## 2022-04-02 DIAGNOSIS — N411 Chronic prostatitis: Secondary | ICD-10-CM | POA: Diagnosis not present

## 2022-04-02 DIAGNOSIS — N4231 Prostatic intraepithelial neoplasia: Secondary | ICD-10-CM | POA: Diagnosis not present

## 2022-04-02 DIAGNOSIS — R972 Elevated prostate specific antigen [PSA]: Secondary | ICD-10-CM | POA: Diagnosis not present

## 2022-05-07 DIAGNOSIS — M1611 Unilateral primary osteoarthritis, right hip: Secondary | ICD-10-CM | POA: Diagnosis not present

## 2022-05-11 DIAGNOSIS — R739 Hyperglycemia, unspecified: Secondary | ICD-10-CM | POA: Diagnosis not present

## 2022-05-27 DIAGNOSIS — Z0189 Encounter for other specified special examinations: Secondary | ICD-10-CM | POA: Diagnosis not present

## 2022-06-02 DIAGNOSIS — Z1211 Encounter for screening for malignant neoplasm of colon: Secondary | ICD-10-CM | POA: Diagnosis not present

## 2022-06-02 DIAGNOSIS — K5730 Diverticulosis of large intestine without perforation or abscess without bleeding: Secondary | ICD-10-CM | POA: Diagnosis not present

## 2022-06-09 ENCOUNTER — Ambulatory Visit: Payer: BC Managed Care – PPO | Admitting: Urology

## 2022-06-09 VITALS — BP 165/125 | HR 70 | Ht 63.0 in | Wt 161.2 lb

## 2022-06-09 DIAGNOSIS — R972 Elevated prostate specific antigen [PSA]: Secondary | ICD-10-CM | POA: Diagnosis not present

## 2022-06-09 DIAGNOSIS — Z87898 Personal history of other specified conditions: Secondary | ICD-10-CM

## 2022-06-09 NOTE — Progress Notes (Signed)
I, Amy L Pierron, acting as a scribe for Vanna Scotland, MD.,have documented all relevant documentation on the behalf of Vanna Scotland, MD,as directed by  Vanna Scotland, MD while in the presence of Vanna Scotland, MD.  06/09/2022 2:58 PM   Nathan Sharp 11-29-1960 [id number]  Referring provider: Gaspar Garbe, MD [address] [place],  [place] [address]  Chief Complaint  Patient presents with   Establish Care   Elevated PSA    Second opinion    HPI: 62 year-old male with a personal history of elevated/ rising PSA presents today for a second opinion.  He has been followed at [facility].  He recently had a prostate MRI which showed prostate volume at 51 cc's and PSA density of 0.14. He had a PI-RADS 4 lesion at the left posterior medial peripheral zone at the base which was targeted at the time of biopsy. At the time his PSA was as high as 9 nanograms per deciliter. There was a suspicious rectal exam by outside urologist prior to [facility].   Pathology from the biopsy completed on February 29th, 2024 was negative. Region of interest was also negative. 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   Prior to [facility] he was seeing Dr. Cardell Peach at [facility]. His PSA was around 3 in 2021, about 7-9 in 2022, returned closer to baseline 4.3 in 2023, rose up to 7.5 in November 2023 and 7.8 also in November 2023 on repeat. He was asymptomatic without evidence of prostatitis. He has no significant baseline urinary symptoms. On Dr. Dillard Essex rectal exams he palpated a midline firm nodule.  He is doing well overall with no urinary issues or complaints.   PMH: Past Medical History:  Diagnosis Date   Depression    High cholesterol     Surgical History: Past Surgical History:  Procedure Laterality Date   LUMBAR LAMINECTOMY/DECOMPRESSION MICRODISCECTOMY Bilateral 09/22/2021   Procedure: LUMBAR BILATERAL Lumbar Two-Three, Lumbar Three-Four, Lumbar Four-Five  LAMINOTOMY, FORAMINOTOMY;  Surgeon: Tressie Stalker, MD;  Location: [facility] OR;  Service: Neurosurgery;  Laterality: Bilateral;   WISDOM TOOTH EXTRACTION      Home Medications:  Allergies as of 06/09/2022   No Known Allergies      Medication List        Accurate as of Jun 09, 2022  2:58 PM. If you have any questions, ask your nurse or doctor.          STOP taking these medications    cyclobenzaprine 10 MG tablet Commonly known as: FLEXERIL   docusate sodium 100 MG capsule Commonly known as: COLACE   ibuprofen 200 MG tablet Commonly known as: ADVIL   meclizine 25 MG tablet Commonly known as: ANTIVERT   oxyCODONE-acetaminophen 5-325 MG tablet Commonly known as: PERCOCET/ROXICET       TAKE these medications    desvenlafaxine 50 MG 24 hr tablet Commonly known as: PRISTIQ Take 50 mg by mouth daily.   rosuvastatin 10 MG tablet Commonly known as: CRESTOR Take 10 mg by mouth daily.        Social History:  reports that he has never smoked. He has never used smokeless tobacco. He reports current alcohol use of about 2.0 standard drinks of alcohol per week. He reports that he does not currently use drugs.   Physical Exam: BP (!) 165/125   Pulse 70   Ht 5\' 3"  (1.6 m)   Wt 161 lb 4 oz (73.1 kg)   BMI 28.56 kg/m  Constitutional:  Alert and oriented, No acute distress. HEENT: [place] AT, moist mucus membranes.  Trachea midline, no masses. Neurologic: Grossly intact, no focal deficits, moving all 4 extremities. Psychiatric: Normal mood and affect.   Assessment & Plan:    History of elevated PSA  - We reviewed the implications of an elevated PSA and the uncertainty surrounding it. In general, a man's PSA increases with age and is produced by both normal and cancerous prostate tissue. Differential for elevated PSA is BPH, prostate cancer, infection, recent intercourse/ejaculation, prostate infarction, recent urethroscopic manipulation (foley placement/cystoscopy) and  prostatitis. Management of an elevated PSA can include observation or prostate biopsy and we discussed this in detail.  - We discussed that indications for prostate biopsy are defined by age and race specific PSA cutoffs as well as a PSA velocity of 0.75/year.  - Told him the treatments he has had up to this point are exactly what I would have recommended as well. He already has another MRI scheduled for an annual check and will continue with this protocol at the other clinic.  Return if symptoms worsen or fail to improve.  [facility] [address] [place], [place] [address] [phone]

## 2022-06-23 DIAGNOSIS — M1611 Unilateral primary osteoarthritis, right hip: Secondary | ICD-10-CM | POA: Diagnosis not present

## 2022-11-12 DIAGNOSIS — R051 Acute cough: Secondary | ICD-10-CM | POA: Diagnosis not present

## 2022-11-12 DIAGNOSIS — J189 Pneumonia, unspecified organism: Secondary | ICD-10-CM | POA: Diagnosis not present

## 2022-12-22 DIAGNOSIS — E7800 Pure hypercholesterolemia, unspecified: Secondary | ICD-10-CM | POA: Diagnosis not present

## 2022-12-22 DIAGNOSIS — R739 Hyperglycemia, unspecified: Secondary | ICD-10-CM | POA: Diagnosis not present

## 2022-12-22 DIAGNOSIS — Z79899 Other long term (current) drug therapy: Secondary | ICD-10-CM | POA: Diagnosis not present

## 2023-01-04 DIAGNOSIS — Z1331 Encounter for screening for depression: Secondary | ICD-10-CM | POA: Diagnosis not present

## 2023-01-04 DIAGNOSIS — R82998 Other abnormal findings in urine: Secondary | ICD-10-CM | POA: Diagnosis not present

## 2023-01-04 DIAGNOSIS — E7800 Pure hypercholesterolemia, unspecified: Secondary | ICD-10-CM | POA: Diagnosis not present

## 2023-01-04 DIAGNOSIS — Z1339 Encounter for screening examination for other mental health and behavioral disorders: Secondary | ICD-10-CM | POA: Diagnosis not present

## 2023-01-04 DIAGNOSIS — Z0000 Encounter for general adult medical examination without abnormal findings: Secondary | ICD-10-CM | POA: Diagnosis not present

## 2023-02-14 IMAGING — CT CT L SPINE W/ CM
1 of 6 series · 7 of 14 positions shown, 9 images · non-contrast
Comparison: Lumbar spine MRI 05/29/2020

CLINICAL DATA: Lumbar spinal stenosis with neurogenic claudication.
Low back, buttock, and thigh pain, right worse than left.
TECHNIQUE: Contiguous axial images were obtained through the Lumbar spine after
the intrathecal infusion of contrast. Coronal and sagittal
reconstructions were obtained of the axial image sets.

[Series 3: l spine soft · axial · 0.32mm/px · z∈[-922,-750]mm · 7 of 116 slices shown, 9 images]
[im 15/116  soft-tissue]
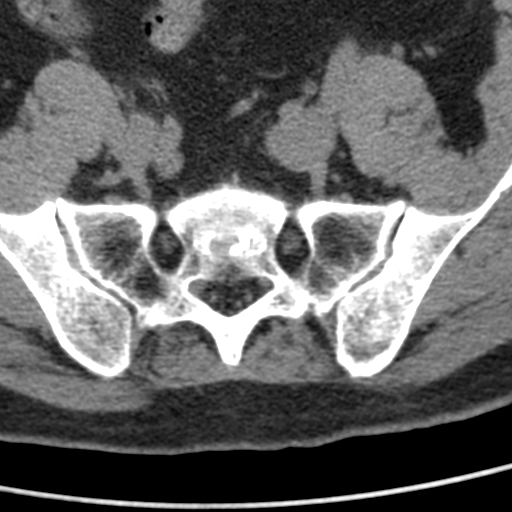
[im 15/116  bone]
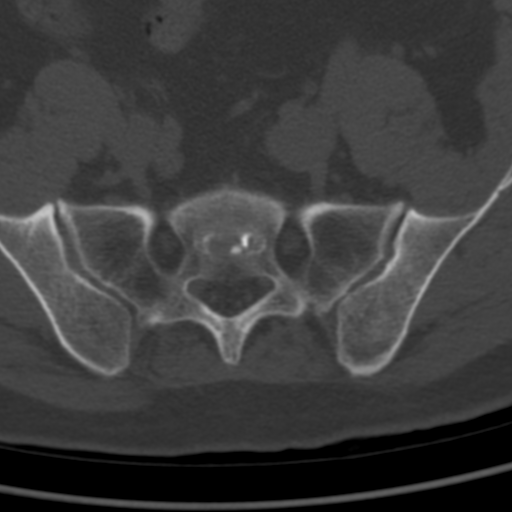
[im 29/116  bone]
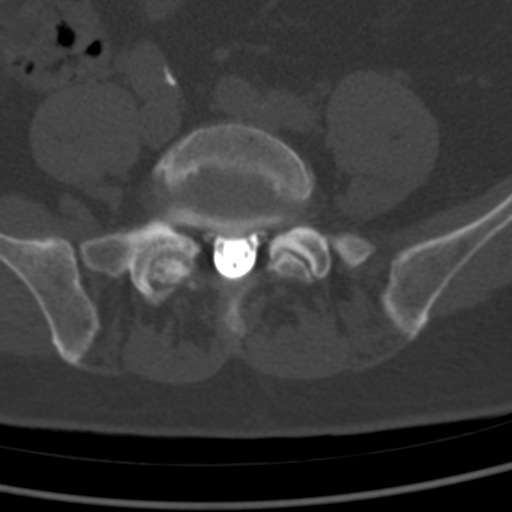
[im 44/116  bone]
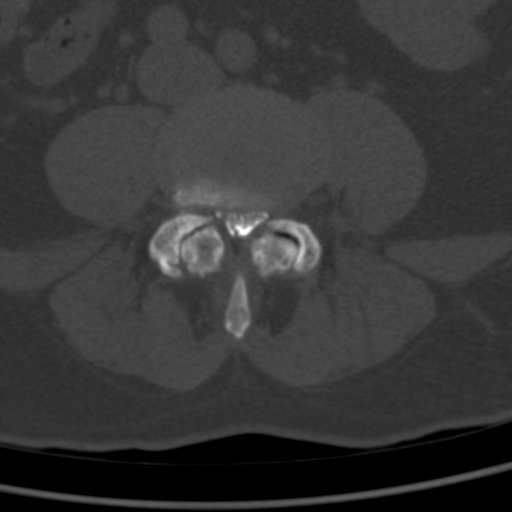
[im 58/116  bone]
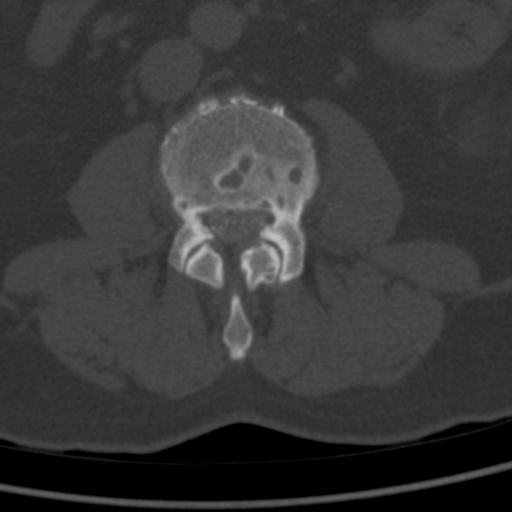
[im 72/116  soft-tissue]
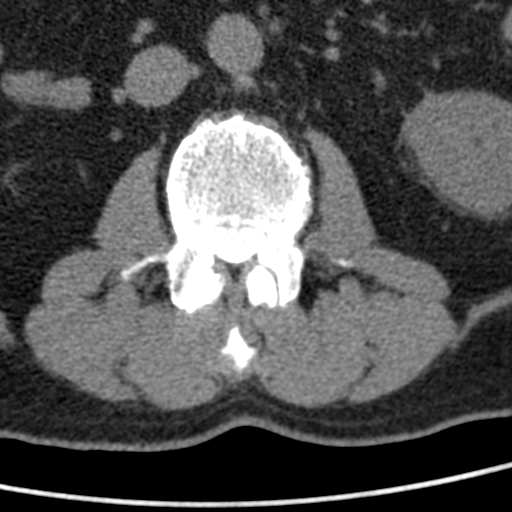
[im 72/116  bone]
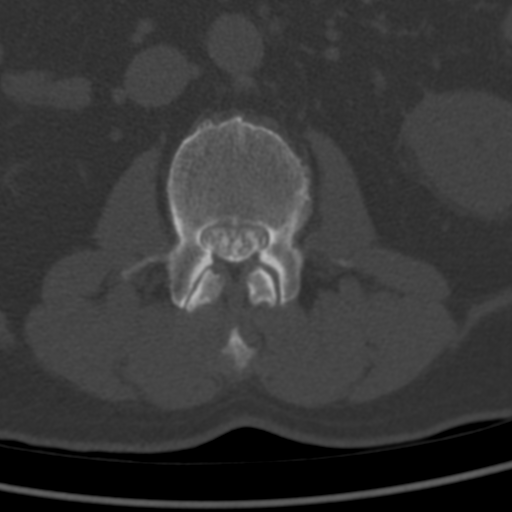
[im 87/116  bone]
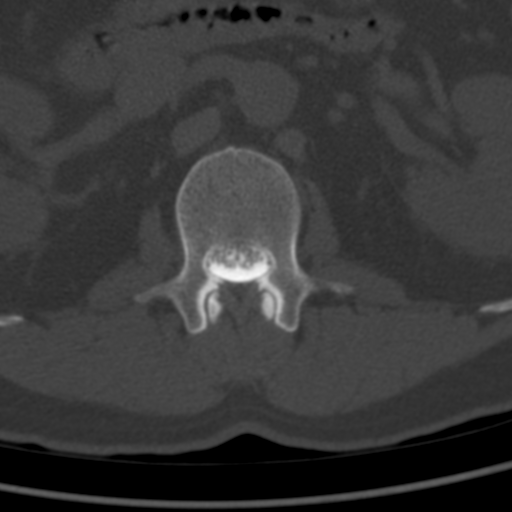
[im 101/116  bone]
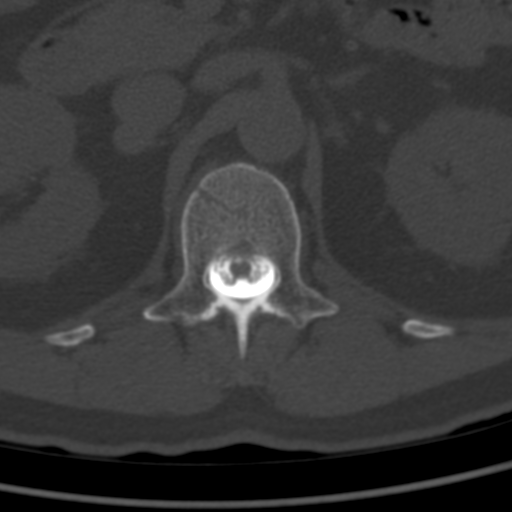

[7 of 14 positions shown; findings below may reference images not displayed]

EXAM:
LUMBAR MYELOGRAM

FLUOROSCOPY:
Fluoroscopy Time: 31 seconds

Radiation Exposure Index: 9.90 mGy

PROCEDURE:
After thorough discussion of risks and benefits of the procedure
including bleeding, infection, injury to nerves, blood vessels,
adjacent structures as well as headache and CSF leak, written and
oral informed consent was obtained. Consent was obtained by Dr.
Ferienhaus Erxleben. Time out form was completed.

Patient was positioned prone on the fluoroscopy table. Local
anesthesia was provided with 1% lidocaine without epinephrine after
prepped and draped in the usual sterile fashion. Puncture was
performed at L5-S1 using a 3 1/2 inch 22-gauge spinal needle via a
left interlaminar approach. Using a single pass through the dura,
the needle was placed within the thecal sac, with return of clear
CSF. 15 mL of Isovue L-UMM was injected into the thecal sac, with
normal opacification of the nerve roots and cauda equina consistent
with free flow within the subarachnoid space.

I personally performed the lumbar puncture and administered the
intrathecal contrast. I also personally supervised acquisition of
the myelogram images.
FINDINGS: LUMBAR MYELOGRAM FINDINGS:

There are 5 non rib-bearing lumbar type vertebrae. Trace
retrolisthesis of L2 on L3 does not change with flexion or
extension. A ventral extradural defect at L3-4 results in moderate
to severe spinal stenosis. A ventral extradural defect at L2-3
worsens with standing, with resultant spinal stenosis appearing
moderate to severe on a standing extension radiograph. A ventral
extradural defect results in milder spinal stenosis at L4-5. There
is effacement of the L4 greater than L3 and L5 nerve roots
bilaterally.

CT LUMBAR MYELOGRAM FINDINGS:

Slight S-shaped lumbar scoliosis and trace retrolisthesis of L2 on
L3 are noted. No fracture or suspicious osseous lesion is
identified. An L4 superior endplate Schmorl's node is unchanged.
Moderate disc space narrowing, vacuum disc phenomenon, and moderate
degenerative endplate changes are noted at L2-3 and L3-4, and there
is milder disc space narrowing at L4-5.

The conus medullaris terminates at L1-2. There is a redundant
appearance of the cauda equina related to high-grade spinal stenosis
at L3-4. The paraspinal soft tissues are unremarkable.

T12-L1 and L1-2: Mild facet arthrosis without disc herniation or
stenosis.

L2-3: Disc bulging, a left subarticular to left foraminal disc
protrusion, and moderate facet arthrosis result in mild-to-moderate
spinal stenosis, mild right and moderate left lateral recess
stenosis, and mild left neural foraminal stenosis, not significantly
changed from the prior MRI. Potential left L3 nerve root
impingement.

L3-4: Circumferential disc bulging and mild-to-moderate right and
moderate to severe left facet arthrosis result in moderate to severe
spinal stenosis, left slightly worse than right lateral recess
stenosis, and mild left greater than right neural foraminal
stenosis, unchanged from the prior MRI. Potential bilateral L4 nerve
root impingement.

L4-5: Disc bulging mildly eccentric to the right and severe facet
arthrosis result in mild-to-moderate spinal stenosis,
mild-to-moderate right greater than left lateral recess stenosis,
and mild-to-moderate right and borderline to mild left neural
foraminal stenosis, unchanged. Some potential for the right L4 and
bilateral L5 nerve roots to be affected.

L5-S1: Mild disc bulging and severe right and mild-to-moderate left
facet arthrosis result in moderate right and mild left neural
foraminal stenosis without spinal stenosis, unchanged.
IMPRESSION: 1. Disc degeneration worst at L3-4 where there is moderate to severe
spinal stenosis.
2. Spinal stenosis at L2-3 which worsens with standing and is
moderate to severe with extension.
3. Mild-to-moderate spinal stenosis at L4-5.
4. Moderate right neural foraminal stenosis at L5-S1.

## 2023-02-14 IMAGING — XA DG MYELOGRAPHY LUMBAR INJ LUMBOSACRAL
13 of 16 series · 13 of 16 positions shown · non-contrast
Comparison: Lumbar spine MRI 05/29/2020

CLINICAL DATA: Lumbar spinal stenosis with neurogenic claudication.
Low back, buttock, and thigh pain, right worse than left.
TECHNIQUE: Contiguous axial images were obtained through the Lumbar spine after
the intrathecal infusion of contrast. Coronal and sagittal
reconstructions were obtained of the axial image sets.

[Series 1: vasc standard · 1 of 1 slices shown (1 of 10)]
[im 1/1]
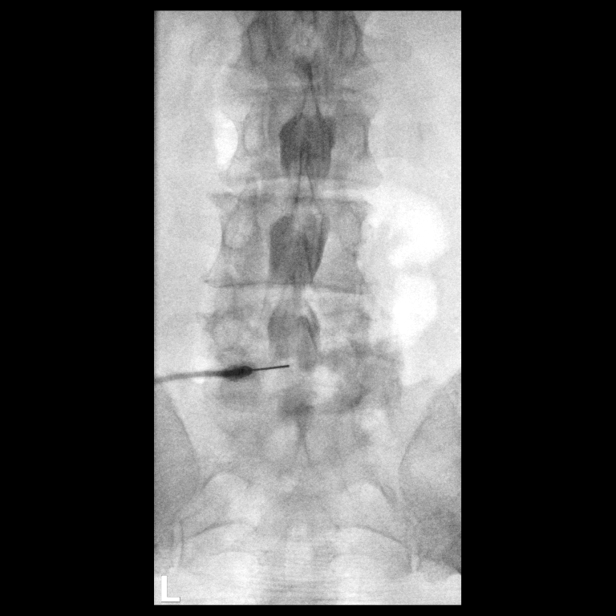

[Series 1: w lumbar spine lat · 0.15mm/px · 1 of 1 slices shown]
[im 1/1]
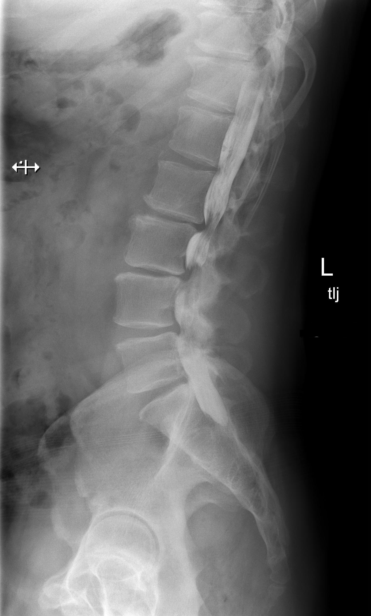

[Series 2: w lumbar spine flexion · 0.15mm/px · 1 of 1 slices shown]
[im 1/1]
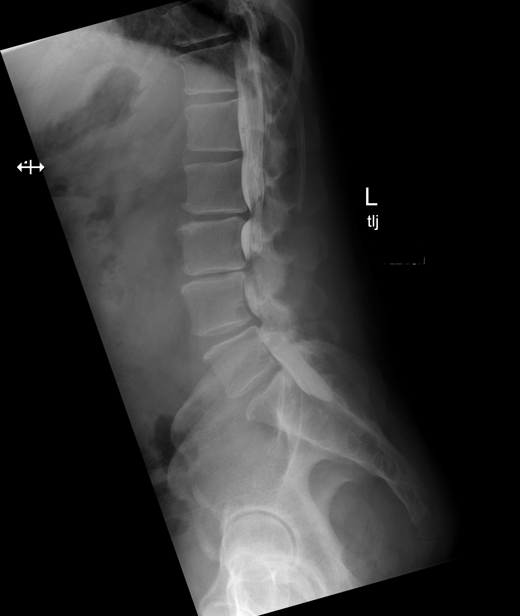

[Series 3: w lumbar spine extension · 0.15mm/px · 1 of 1 slices shown]
[im 1/1]
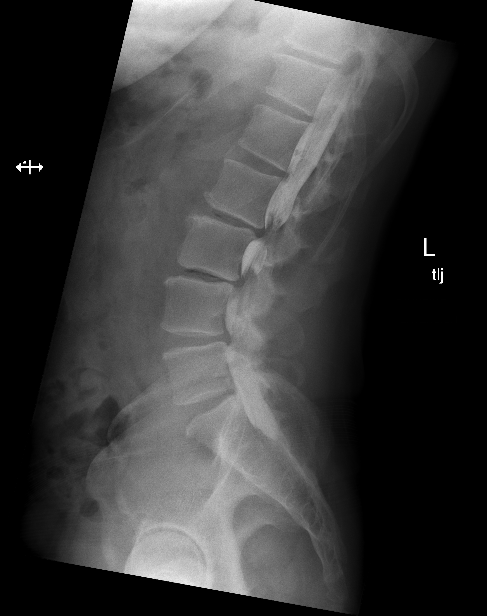

[Series 3: vasc standard · 1 of 1 slices shown (2 of 10)]
[im 1/1]
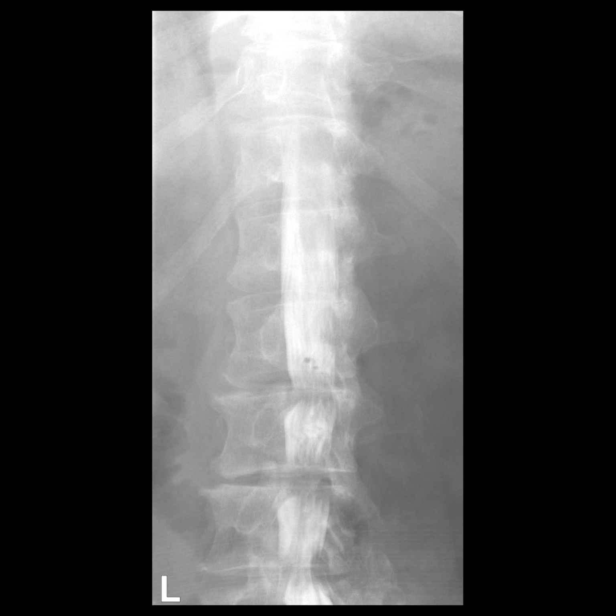

[Series 4: vasc standard · 1 of 1 slices shown (3 of 10)]
[im 1/1]
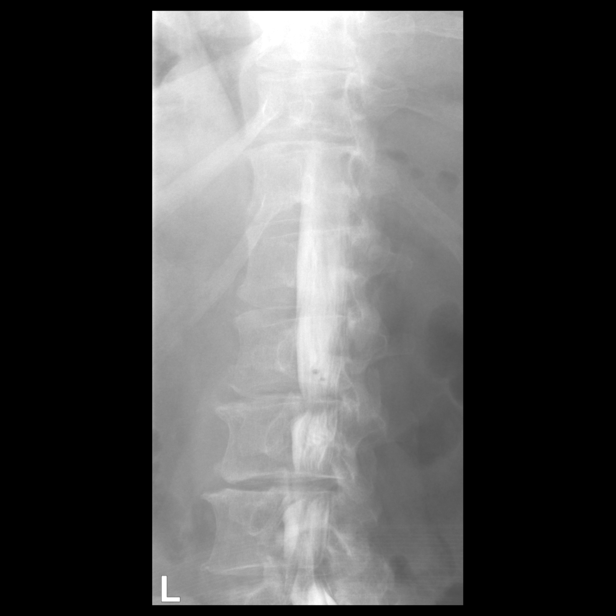

[Series 6: vasc standard · 1 of 1 slices shown (4 of 10)]
[im 1/1]
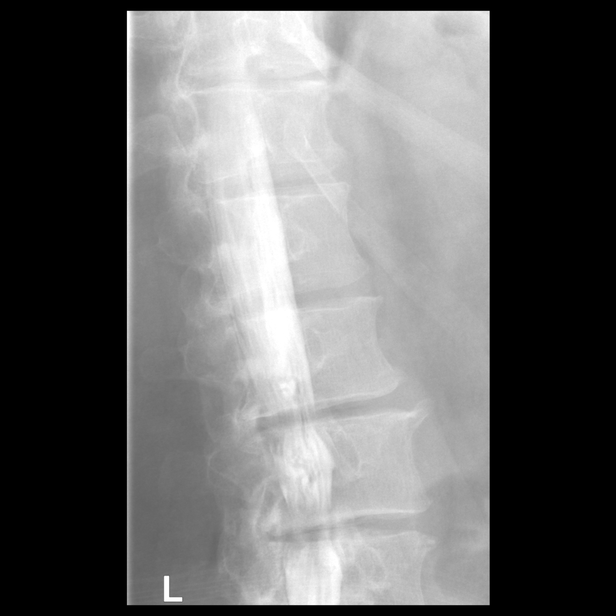

[Series 7: vasc standard · 1 of 1 slices shown (5 of 10)]
[im 1/1]
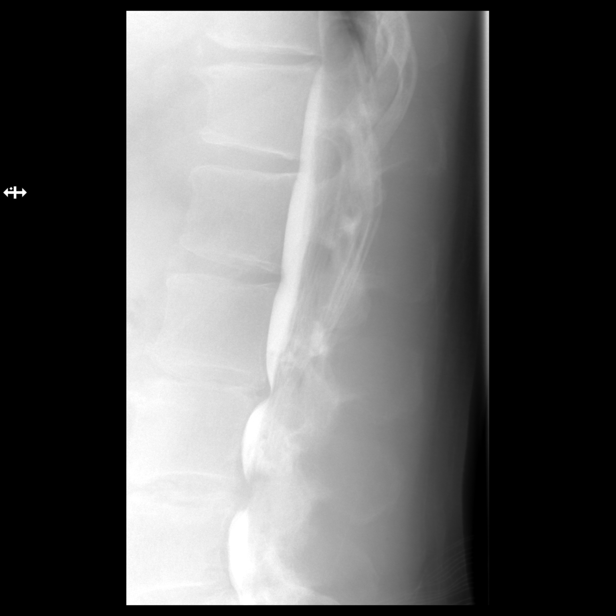

[Series 8: vasc standard · 1 of 1 slices shown (6 of 10)]
[im 1/1]
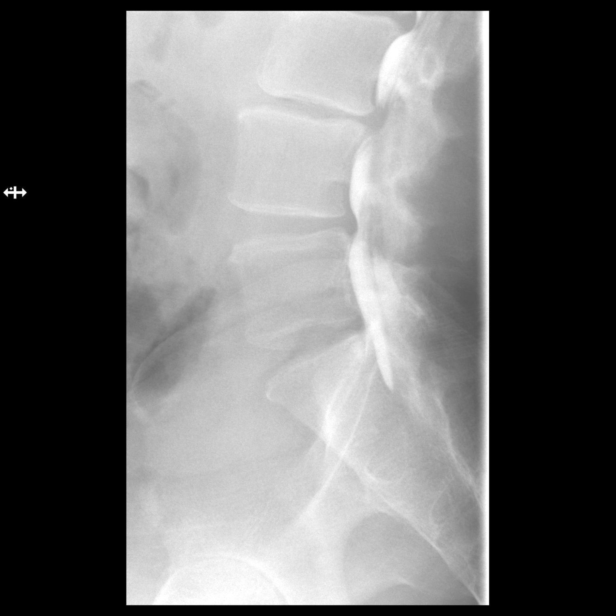

[Series 9: vasc standard · 1 of 1 slices shown (7 of 10)]
[im 1/1]
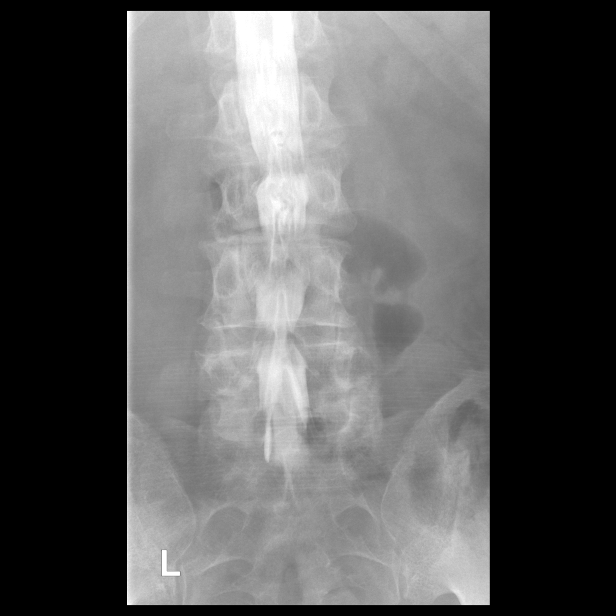

[Series 10: vasc standard · 1 of 1 slices shown (8 of 10)]
[im 1/1]
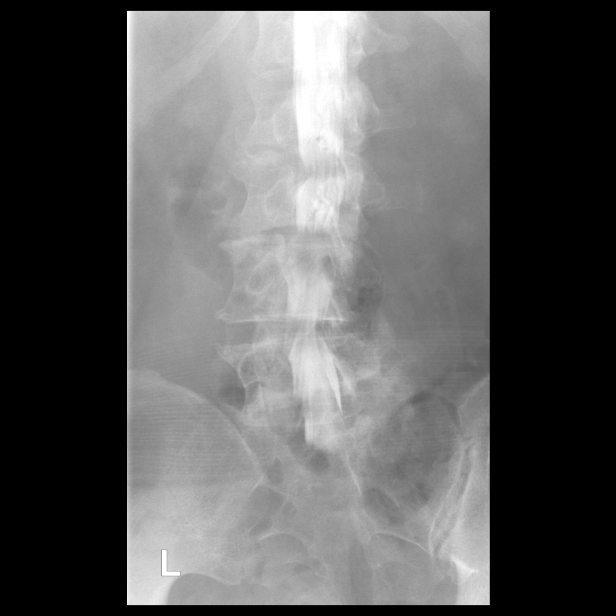

[Series 12: vasc standard · 1 of 1 slices shown (9 of 10)]
[im 1/1]
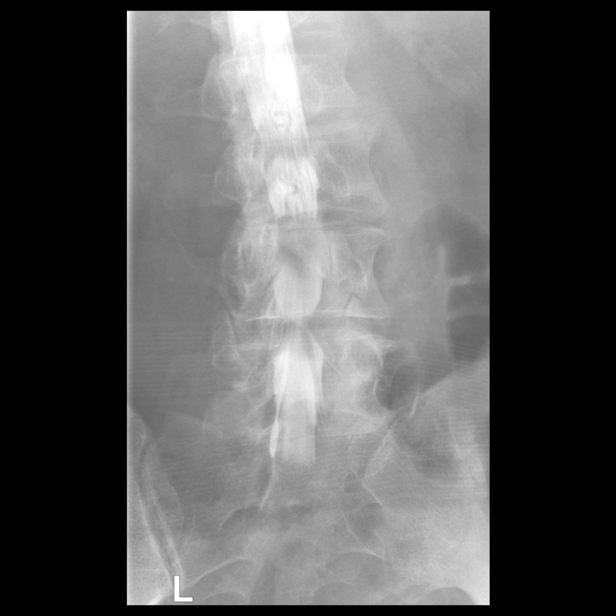

[Series 13: vasc standard · 1 of 1 slices shown (10 of 10)]
[im 1/1]
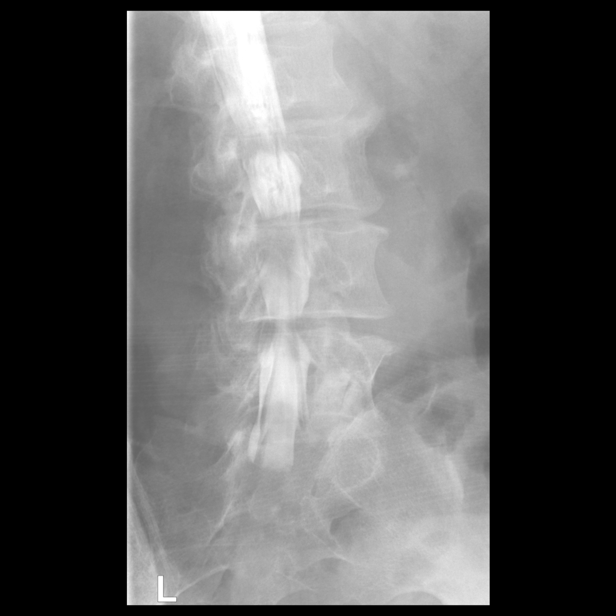

[13 of 16 positions shown; findings below may reference images not displayed]

EXAM:
LUMBAR MYELOGRAM

FLUOROSCOPY:
Fluoroscopy Time: 31 seconds

Radiation Exposure Index: 9.90 mGy

PROCEDURE:
After thorough discussion of risks and benefits of the procedure
including bleeding, infection, injury to nerves, blood vessels,
adjacent structures as well as headache and CSF leak, written and
oral informed consent was obtained. Consent was obtained by Dr.
Ferienhaus Erxleben. Time out form was completed.

Patient was positioned prone on the fluoroscopy table. Local
anesthesia was provided with 1% lidocaine without epinephrine after
prepped and draped in the usual sterile fashion. Puncture was
performed at L5-S1 using a 3 1/2 inch 22-gauge spinal needle via a
left interlaminar approach. Using a single pass through the dura,
the needle was placed within the thecal sac, with return of clear
CSF. 15 mL of Isovue L-UMM was injected into the thecal sac, with
normal opacification of the nerve roots and cauda equina consistent
with free flow within the subarachnoid space.

I personally performed the lumbar puncture and administered the
intrathecal contrast. I also personally supervised acquisition of
the myelogram images.
FINDINGS: LUMBAR MYELOGRAM FINDINGS:

There are 5 non rib-bearing lumbar type vertebrae. Trace
retrolisthesis of L2 on L3 does not change with flexion or
extension. A ventral extradural defect at L3-4 results in moderate
to severe spinal stenosis. A ventral extradural defect at L2-3
worsens with standing, with resultant spinal stenosis appearing
moderate to severe on a standing extension radiograph. A ventral
extradural defect results in milder spinal stenosis at L4-5. There
is effacement of the L4 greater than L3 and L5 nerve roots
bilaterally.

CT LUMBAR MYELOGRAM FINDINGS:

Slight S-shaped lumbar scoliosis and trace retrolisthesis of L2 on
L3 are noted. No fracture or suspicious osseous lesion is
identified. An L4 superior endplate Schmorl's node is unchanged.
Moderate disc space narrowing, vacuum disc phenomenon, and moderate
degenerative endplate changes are noted at L2-3 and L3-4, and there
is milder disc space narrowing at L4-5.

The conus medullaris terminates at L1-2. There is a redundant
appearance of the cauda equina related to high-grade spinal stenosis
at L3-4. The paraspinal soft tissues are unremarkable.

T12-L1 and L1-2: Mild facet arthrosis without disc herniation or
stenosis.

L2-3: Disc bulging, a left subarticular to left foraminal disc
protrusion, and moderate facet arthrosis result in mild-to-moderate
spinal stenosis, mild right and moderate left lateral recess
stenosis, and mild left neural foraminal stenosis, not significantly
changed from the prior MRI. Potential left L3 nerve root
impingement.

L3-4: Circumferential disc bulging and mild-to-moderate right and
moderate to severe left facet arthrosis result in moderate to severe
spinal stenosis, left slightly worse than right lateral recess
stenosis, and mild left greater than right neural foraminal
stenosis, unchanged from the prior MRI. Potential bilateral L4 nerve
root impingement.

L4-5: Disc bulging mildly eccentric to the right and severe facet
arthrosis result in mild-to-moderate spinal stenosis,
mild-to-moderate right greater than left lateral recess stenosis,
and mild-to-moderate right and borderline to mild left neural
foraminal stenosis, unchanged. Some potential for the right L4 and
bilateral L5 nerve roots to be affected.

L5-S1: Mild disc bulging and severe right and mild-to-moderate left
facet arthrosis result in moderate right and mild left neural
foraminal stenosis without spinal stenosis, unchanged.
IMPRESSION: 1. Disc degeneration worst at L3-4 where there is moderate to severe
spinal stenosis.
2. Spinal stenosis at L2-3 which worsens with standing and is
moderate to severe with extension.
3. Mild-to-moderate spinal stenosis at L4-5.
4. Moderate right neural foraminal stenosis at L5-S1.

## 2023-05-03 DIAGNOSIS — R972 Elevated prostate specific antigen [PSA]: Secondary | ICD-10-CM | POA: Diagnosis not present

## 2023-05-03 DIAGNOSIS — N400 Enlarged prostate without lower urinary tract symptoms: Secondary | ICD-10-CM | POA: Diagnosis not present

## 2023-05-10 DIAGNOSIS — R972 Elevated prostate specific antigen [PSA]: Secondary | ICD-10-CM | POA: Diagnosis not present

## 2023-07-27 DIAGNOSIS — L209 Atopic dermatitis, unspecified: Secondary | ICD-10-CM | POA: Diagnosis not present

## 2023-07-27 DIAGNOSIS — G4719 Other hypersomnia: Secondary | ICD-10-CM | POA: Diagnosis not present

## 2023-07-27 DIAGNOSIS — Z6827 Body mass index (BMI) 27.0-27.9, adult: Secondary | ICD-10-CM | POA: Diagnosis not present

## 2023-07-28 DIAGNOSIS — G4719 Other hypersomnia: Secondary | ICD-10-CM | POA: Diagnosis not present

## 2023-11-01 ENCOUNTER — Inpatient Hospital Stay: Attending: Licensed Clinical Social Worker | Admitting: Licensed Clinical Social Worker

## 2023-11-01 NOTE — Progress Notes (Signed)
 [facility] CSW Counseling Note  Patient was referred by self. Treatment type: Individual  Presenting Concerns: Patient and/or family reports the following symptoms/concerns: grief Duration of problem: since 10/11/2023; Severity of problem: moderate   Orientation:oriented to person, place, time/date, and situation.   Affect: Appropriate and Congruent Risk of harm to self or others: No plan to harm self or others  Patient and/or Family's Strengths/Protective Factors: Social connections, Social and Emotional competence, and Concrete supports in place (healthy food, safe environments, etc.)Ability for insight  Capable of independent living  Communication skills  Religious Affiliation  Supportive family/friends      Goals Addressed: Patient will:  Increase healthy adjustment to current life circumstances and Begin healthy grieving over loss of expected life plan   Progress towards Goals: Initial   Interventions: Interventions utilized:  Supportive Counseling and Psychoeducation and/or Health Education      Assessment: Client currently experiencing grief and sadness surrounding wife's diagnosis of metastatic cancer (endometrial to lung) and expected limited life span. They have been married for 37 years and have 3 adult children.  Nathan Sharp is balancing grieving with trying to remain positive and strong for his family.   Discussed what is typical in regard to emotional responses for client & wife. Encouraged to continue his own self-care and activities that bring joy, such as being social and working out. Discussed having conversations with their children and being able to voice not only the medical information, but also their feelings about it.    Began discussion around quality of life and the variation by individual and how that will be a discussion between him & his wife as cancer progresses.    Plan: Follow up with CSW: 2 weeks Behavioral recommendations: continue to engage in activities  that are beneficial for your mental health, like socializing and working out. Set time and space where you can grieve - you can do so in counseling, with journaling, or quiet reflective time. Then try to focus on the here and now, especially until after the first scans to see if treatment is having an impact Referral(s): n/a       Coolidge Gossard E Naveen Clardy, LCSW

## 2023-11-03 ENCOUNTER — Ambulatory Visit: Admitting: Psychology

## 2023-11-03 DIAGNOSIS — F4323 Adjustment disorder with mixed anxiety and depressed mood: Secondary | ICD-10-CM | POA: Diagnosis not present

## 2023-11-03 NOTE — Progress Notes (Signed)
 [facility] Counselor Initial Adult Exam  Name: Nathan Sharp Date: 11/03/2023 MRN: [id number] DOB: Jun 28, 1960 PCP: Tisovec, Richard W, MD  Time Spent: 12:00 pm - 12:53 pm :  53 minutes   Guardian/Payee:  Self    Paperwork requested: Yes  already have received new patient paperwork  Reason for Visit /Presenting Problem: Patient's wife is dying from stage 4 cancer.  Mental Status Exam: Appearance:   Well Groomed     Behavior:  Appropriate  Motor:  Normal  Speech/Language:   Normal Rate  Affect:  Appropriate  Mood:  sad  Thought process:  normal  Thought content:    WNL  Sensory/Perceptual disturbances:    WNL  Orientation:  oriented to person, place, and time/date  Attention:  Good  Concentration:  Good  Memory:  WNL  Fund of knowledge:   Good  Insight:    Good  Judgment:   Good  Impulse Control:  Good   Reported Symptoms:  Feeling nervous, anxious or on edge, not being able to stop or control worrying, feeling down, depressed, or hopeless.   Risk Assessment: Danger to Self:  No Self-injurious Behavior: No Danger to Others: No Duty to Warn:no Physical Aggression / Violence:No  Access to Firearms a concern: No  Gang Involvement:No  Patient / guardian was educated about steps to take if suicide or homicide risk level increases between visits: no While future psychiatric events cannot be accurately predicted, the patient does not currently require acute inpatient psychiatric care and does not currently meet [facility]  involuntary commitment criteria.  Substance Abuse History: Current substance abuse: No     Caffeine: one soda a day Tobacco: Alcohol : socially  Substance use: N/A  Past Psychiatric History:   Previous psychological history is significant for anxiety Lexapro 25 years Outpatient Providers:saw Alm Ferretti in the past, [facility] visit History of Psych Hospitalization: No  Psychological  Testing: No   Abuse History:  Victim of: No., No   Report needed: No. Victim of Neglect:No. Perpetrator of No  Witness / Exposure to Domestic Violence: No   Protective Services Involvement: No  Witness to [employer] Violence:  No   Family History: Brother Meribeth 1 yo and sister in law, Lonell, 2 grown kids  Living situation: the patient lives with their spouse  Sexual Orientation: Straight  Relationship Status: married 37 years marriage, 38 years known each other, met at a wedding Name of spouse / other:Cheryl If a parent, number of children / ages:Blair is 44 yo, daughter, has a little boy named Winford is 63 yo, loves his [place], Bethann) 45 [place] 37 yo daughter in [place], [place] , [place], getting married to Fairy Hamburg will be in [place], [place] Dec. 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  Stressors: Health problems  , life   Strengths: Supportive Relationships  Barriers:  None   Legal History: Pending legal issue / charges: The patient has no significant history of legal issues. History of legal issue / charges: No  Medical History/Surgical History: not reviewed Past Medical History:  Diagnosis Date   Depression  High cholesterol     Past Surgical History:  Procedure Laterality Date   LUMBAR LAMINECTOMY/DECOMPRESSION MICRODISCECTOMY Bilateral 09/22/2021   Procedure: LUMBAR BILATERAL Lumbar Two-Three, Lumbar Three-Four, Lumbar Four-Five LAMINOTOMY, FORAMINOTOMY;  Surgeon: Mavis Purchase, MD;  Location: [facility] OR;  Service: Neurosurgery;  Laterality:  Bilateral;   WISDOM TOOTH EXTRACTION      Medications: Current Outpatient Medications  Medication Sig Dispense Refill   desvenlafaxine (PRISTIQ) 50 MG 24 hr tablet Take 50 mg by mouth daily.     rosuvastatin  (CRESTOR ) 10 MG tablet Take 10 mg by mouth daily.     No current facility-administered medications for this visit.    No Known Allergies  Diagnoses: Adjustment disorder with mixed anxiety and depressed mood [F43.23   Psychiatric Treatment: No   Plan of Care: OPT  Narrative:  Oneil FORBES Boy participated from office with therapist and consented to treatment. We reviewed the limits of confidentiality prior to the start of the evaluation. Charley E Goodwill expressed understanding and agreement to proceed.   Patient is a 63 year old who presented for an initial assessment. Patient reported the following symptoms: Feeling nervous, anxious or on edge, not being able to stop or control worrying, feeling down, depressed, or hopeless. Patient denied current and past suicidal ideation, homicidal ideation, and symptoms of psychosis. Patient reported no history of tobacco use. Patient denied current or past alcohol  or drug abuse.Patient reported current stressors as his wife dying of cancer, and they just learned that patient's wife's diagnosis is terminal. Patient identified current supports as his wife Channing, their family and an extensive number of friends. Patient has health concerns Patient practices self care by doing Pilates 3-4 times per week. Patient's wife is practicing self care by seeing a death doula in [place].   A follow-up was scheduled to create a treatment plan and begin treatment. Therapist answered  and all questions during the evaluation and contact information was provided.      Jenkins CHRISTELLA Nicolas

## 2023-11-17 ENCOUNTER — Inpatient Hospital Stay: Attending: Licensed Clinical Social Worker | Admitting: Licensed Clinical Social Worker

## 2023-11-17 NOTE — Progress Notes (Signed)
 [facility] CSW Counseling Note  Patient was referred by self. Treatment type: Individual  Presenting Concerns: Patient and/or family reports the following symptoms/concerns: grief Duration of problem: since 10/11/2023; Severity of problem: moderate   Orientation:oriented to person, place, time/date, and situation.   Affect: Appropriate and Congruent Risk of harm to self or others: No plan to harm self or others  Patient and/or Family's Strengths/Protective Factors: Social connections, Social and Emotional competence, and Concrete supports in place (healthy food, safe environments, etc.)Ability for insight  Capable of independent living  Communication skills  Religious Affiliation  Supportive family/friends      Goals Addressed: Patient will:  Increase healthy adjustment to current life circumstances and Begin healthy grieving over loss of expected life plan   Progress towards Goals: Progressing   Interventions: Interventions utilized:  Supportive Counseling and Psychoeducation and/or Health Education      Assessment: Client reports more mood stability and able to be present in the moment and enjoy the days his wife is feeling well. He was able to voice how he feels to his kids which they reported being aware of and they also have their own support.  Client continues to exercise and have social time. He is sleeping fairly well- currently awakening maybe once per night for 20-30 min when he thinks about end of life fears before going back to sleep.  He has been able to implement giving time to process and grieve and then refocusing on the present.  No other physical concerns related to stress.  CSW and pt briefly reviewed that there are grounding skills (ex: 5 senses) and breathing exercises that can help if needed at night.   Upcoming plan for scans after wife's 3rd treatment. Discussed that that is usually a time of increased anxiety when waiting for results. Briefly touched on planning  for activities (quiet or social) that can help support client and his wife both individually and as a couple.   currently experiencing grief and sadness surrounding wife's diagnosis of metastatic cancer (endometrial to lung) and expected limited life span. They have been married for 37 years and have 3 adult children.  Fitzpatrick is balancing grieving with trying to remain positive and strong for his family.     Plan: Follow up with CSW: 4 weeks (around the time of wife's scans) Behavioral recommendations: continue to engage in activities that are beneficial for your mental health, like socializing and working out, especially around scan time. Set time and space where you can grieve - you can do so in counseling, with journaling, or quiet reflective time. Then try to focus on the here and now, especially until after the first scans to see if treatment is having an impact.  Referral(s): n/a.  Pt had an appt with another counselor and will decide which is a better fit moving forward       Sarinah Doetsch E Shelsea Hangartner, LCSW

## 2023-11-22 ENCOUNTER — Encounter: Payer: Self-pay | Admitting: Psychology

## 2023-11-22 ENCOUNTER — Ambulatory Visit: Admitting: Psychology

## 2023-11-22 DIAGNOSIS — F4321 Adjustment disorder with depressed mood: Secondary | ICD-10-CM

## 2023-11-22 DIAGNOSIS — F4323 Adjustment disorder with mixed anxiety and depressed mood: Secondary | ICD-10-CM | POA: Diagnosis not present

## 2023-11-22 NOTE — Progress Notes (Signed)
 [facility] Counselor/Therapist Progress Note  Patient ID: Nathan Sharp, MRN: [id number]   Date: 11/22/23  Time Spent: 1:00 pm - 1:53 pm - 53 minutes   Treatment Type: Individual Therapy.  Reported Symptoms: Patient is preparing questions for his wife's next medical appointment.  Mental Status Exam: Appearance:  Casual     Behavior: Sharing  Motor: Normal  Speech/Language:  Clear and Coherent  Affect: Depressed  Mood: sad  Thought process: normal  Thought content:   WNL  Sensory/Perceptual disturbances:   WNL  Orientation: oriented to person, place, and time/date  Attention: Good  Concentration: Good  Memory: WNL  Fund of knowledge:  Good  Insight:   Good  Judgment:  Good  Impulse Control: Good   Risk Assessment: Danger to Self:  No Self-injurious Behavior: No Danger to Others: No Duty to Warn:no Physical Aggression / Violence:No  Access to Firearms a concern: No  Gang Involvement:No   Subjective:   Nathan Sharp participated from home, via video and consented to treatment. Therapist participated from home office. I discussed the limitations of evaluation and management by telemedicine and the availability of in person appointments. The patient expressed understanding and agreed to proceed. Nathan Sharp reviewed the events of the past week.   Patient reported feeling down, having little energy, being worried, and having some trouble with his memory. Next Tuesday, October 28, patient's wife will be in the [facility] hospital, overnight to have Desensitization. Patient will be staying overnight with son and daughter, Nathan Sharp Desensitization treatment. Every two weeks there will be a scan, with a total of 4 treatments, then wedding, then 2 more treatments. At Cancer center, social worker, Nathan Sharp, patient met with her two times Nathan Sharp asked how come everyone knows that it's bad, but I don't know it's bad. Patient suggested that Nathan Sharp talk to her death doula.  Nathan Sharp  Dr. Viktoria [facility] [place], GYN Oncologist. Patient will be seeing social worker Nathan Sharp at [facility], in one month,   We reviewed numerous treatment approaches including CBT, BA, Problem Solving, and Solution focused therapy.   Psych-education regarding the Nathan Sharp's diagnosis of Adjustment disorder with depressed mood was provided during the session.   We discussed Nathan Sharp's goals treatment goals which include:  Increasing his positivity and keeping his hopes up for his wife, Nathan Sharp, and their adult children.   Continuing to exercise, doing Pilates, etc.   Spending 2-3 times per week with his male friends.  Be as open and communicative as possible with his wife and adult children. .   Nathan Sharp provided verbal approval of the treatment plan.   Interventions: Psycho-education & Goal Setting.   Diagnosis:  Adjustment disorder with mixed anxiety and depressed mood [F43.23   Psychiatric Treatment: Yes , via PCP  Treatment Plan:  Client Abilities/Strengths Nathan Sharp is intelligent, open, engaging, and committed to his wife and adult children  Support System: Wife, adult children, numerous friends  Client Treatment Preferences OPT  Client Statement of Needs Nathan Sharp would like to increase his positivity and keep his hopes up so he can help his wife and adult children.    Treatment Level  Biweekly  Symptoms  Feeling nervous, anxious or on edge, not being able to stop or control worrying, feeling down, depressed, or hopeless.   Goals:   Nathan Sharp experiences symptoms of adjustment disorder with depressed mood.   Treatment plan signed and available on s-drive:  Not yet, but was sent to patient following today's session  Target Date: 11-02-24 Frequency: Biweekly  Progress: 0 Modality: individual    Therapist will provide referrals for additional resources as appropriate.  Therapist will provide psycho-education regarding Nathan Sharp's diagnosis and  corresponding treatment approaches and interventions. Jenkins CHRISTELLA Nicolas will support the patient's ability to achieve the goals identified. will employ CBT, BA, Problem-solving, Solution Focused, Mindfulness,  coping skills, & other evidenced-based practices will be used to promote progress towards healthy functioning to help manage decrease symptoms associated with their diagnosis.   Reduce overall level, frequency, and intensity of the feelings of depression, anxiety and panic evidenced by decreased overall symptoms from 6 to 7 days/week to 0 to 1 days/week per client report for at least 3 consecutive months. Verbally express understanding of the relationship between feelings of depression and their impact on thinking patterns and behaviors. Verbalize an understanding of the role that distorted thinking plays in creating fears, excessive worry, and ruminations.    Alonso participated in the creation of the treatment plan)    Jenkins CHRISTELLA Nicolas

## 2023-12-03 ENCOUNTER — Ambulatory Visit: Admitting: Psychology

## 2023-12-03 DIAGNOSIS — F4323 Adjustment disorder with mixed anxiety and depressed mood: Secondary | ICD-10-CM | POA: Diagnosis not present

## 2023-12-03 NOTE — Progress Notes (Signed)
 [facility] Counselor/Therapist Progress Note  Patient ID: Nathan Sharp, MRN: [id number]    Date: 12/03/23  Time Spent: 4:00 pm - 4:16 pm: 16 minutes (We had been scheduled for an hour but patient had a very positive experience at [facility] and asked if we could put our sessions on hold until after his wife's next treatment, and their daughter's wedding in [place]  that they'll be attending on January 22, 2024. Patient will follow up with this writer via text in January 2026.)  Treatment Type: Individual Therapy.  Reported Symptoms: Patient was relieved after his wife received an infusion at [facility] with Dr. Viktoria.  Mental Status Exam: Appearance:  Casual     Behavior: Appropriate  Motor: Normal  Speech/Language:  Clear and Coherent  Affect: Depressed  Mood: anxious  Thought process: normal  Thought content:   WNL  Sensory/Perceptual disturbances:   WNL  Orientation: oriented to person, place, and time/date  Attention: Good  Concentration: Good  Memory: WNL  Fund of knowledge:  Good  Insight:   Good  Judgment:  Good  Impulse Control: Good   Risk Assessment: Danger to Self:  No Self-injurious Behavior: No Danger to Others: No Duty to Warn:no Physical Aggression / Violence:No  Access to Firearms a concern: No  Gang Involvement:No   Subjective:   Nathan Sharp Boy participated from home, via video, and consented to treatment. I discussed the limitations of evaluation and management by telemedicine and the availability of in person appointments. The patient expressed understanding and agreed to proceed.  Therapist participated from home office.  Nathan Sharp reviewed the events of the past week.   [facility], is a totally different positive experience with their oncology department all women. My emotions have flattened out. Nathan Sharp spoke with her death doula again, and patient was able to meet his wife's death doula. Questions around  how honest to be with patient's wife who is  a cancer survivor continued to be discussed. Patient expressed his relief at how well his wife's infusion went at [facility]. Patient will follow up with this clinical research associate after daughter's wedding in late December, and will contact this writer if he needs to reach out sooner.   Interventions: Cognitive Behavioral Therapy  Diagnosis: Adjustment disorder with mixed anxiety and depressed mood [F43.23]  Psychiatric Treatment: Yes , via PCP  Treatment Plan:  Client Abilities/Strengths Nathan Sharp is intelligent, open, engaging, and committed to his wife and his adult children.   Support System: Family and friends  Client Treatment Preferences OPT  Client Statement of Needs Nathan Sharp would like to increase his poitivity, and keep his hopes up so he can help his wife and adult children.    Treatment Level As needed, patient will contact this clinical research associate after his daughter's wedding at the end of December.   Symptoms  Nathan Sharp experiences symptoms of Adjustment Disorder with mixed anxiety and depressed mood F [id number]  Goals:   Target Date: 10.1.26 Frequency: as needed  Progress: 15% Modality: individual    Therapist will provide referrals for additional resources as appropriate.  Therapist will provide psycho-education regarding Nathan Sharp's diagnosis and corresponding treatment approaches and interventions. Jenkins CHRISTELLA Nicolas will support the patient's ability to achieve the goals identified. will employ CBT, BA, Problem-solving, Solution Focused, Mindfulness,  coping skills, & other evidenced-based practices will be used to promote progress towards healthy functioning to help manage decrease symptoms associated with their diagnosis.   Reduce overall level,  frequency, and intensity of the feelings of depression, anxiety and panic evidenced by decreased overall symptoms from 6 to 7 days/week to 0 to 1 days/week per client report for at least 3 consecutive months. Verbally  express understanding of the relationship between feelings of depression and anxiety and their impact on thinking patterns and behaviors. Verbalize an understanding of the role that distorted thinking plays in creating fears, excessive worry, and ruminations.  Nathan Sharp participated in the creation of the treatment plan)    Jenkins CHRISTELLA Nicolas

## 2023-12-10 ENCOUNTER — Telehealth: Payer: Self-pay | Admitting: Licensed Clinical Social Worker

## 2023-12-10 NOTE — Telephone Encounter (Signed)
 [facility] Clinical Social Work  Returned pt's call to cancel appt next week and discuss how pt can contact CSW to reschedule when ready. Provided direct contact information. Pt foresees scheduling in the new year, although may call sooner depending on how wife's scans go next week.   Nathan Sharp E Eliza Grissinger, LCSW

## 2023-12-14 ENCOUNTER — Inpatient Hospital Stay: Admitting: Licensed Clinical Social Worker

## 2024-03-07 ENCOUNTER — Inpatient Hospital Stay: Admitting: Licensed Clinical Social Worker

## 2024-03-07 NOTE — Progress Notes (Signed)
 [facility] CSW Counseling Note  Patient was referred by self. Treatment type: Individual  Presenting Concerns: Patient and/or family reports the following symptoms/concerns: grief Duration of problem: since 10/11/2023, increase Jan 2026; Severity of problem: moderate   Orientation:oriented to person, place, time/date, and situation.   Affect: Appropriate and Congruent Risk of harm to self or others: No plan to harm self or others  Patient and/or Family's Strengths/Protective Factors: Social connections, Social and Emotional competence, and Concrete supports in place (healthy food, safe environments, etc.)Ability for insight  Capable of independent living  Communication skills  Religious Affiliation  Supportive family/friends      Goals Addressed: Patient will:  Increase healthy adjustment to current life circumstances and Begin healthy grieving over loss of expected life plan   Progress towards Goals: Progressing   Interventions: Interventions utilized:  Supportive Counseling and Psychoeducation and/or Health Education      Assessment: Client reports increase in grief and sadness with recent results from wife's scans which show tumor growth and need for a change in treatment.  He is processing what this means and trying to balance wanting to know what would happen without treatment to supporting wife's desire to try clinical trials (going to [facility] next week).  Discussed ways to broach conversations about quality of life, hopes, and goals. Client is trying to support what his wife needs and wants (she is more introverted, does not want many visitors) versus his desire for more socialization.  He also touched on his fears about how he will manage after his wife passes as she has balanced him for so many years.     Plan: Follow up with CSW: 2 weeks (after consult at [facility]) Behavioral recommendations: continue to engage in activities that are beneficial for your mental health, like  socializing and working out, especially around scan time. Try bringing up the conversations about what goals and hopes are, especially around quality of life. Consider meeting together with wife for a session   Referral(s): n/a.      Nathan Sharp E Nathan Creason, LCSW

## 2024-03-21 ENCOUNTER — Inpatient Hospital Stay: Admitting: Licensed Clinical Social Worker
